# Patient Record
Sex: Male | Born: 1935 | Race: White | Hispanic: No | Marital: Married | Smoking: Former smoker
Health system: Southern US, Community
[De-identification: ages and names within clinical notes are randomized; demographics above are authoritative.]

## PROBLEM LIST (undated history)

## (undated) DIAGNOSIS — R42 Dizziness and giddiness: Secondary | ICD-10-CM

## (undated) DIAGNOSIS — I10 Essential (primary) hypertension: Secondary | ICD-10-CM

## (undated) DIAGNOSIS — N400 Enlarged prostate without lower urinary tract symptoms: Secondary | ICD-10-CM

## (undated) DIAGNOSIS — R451 Restlessness and agitation: Secondary | ICD-10-CM

## (undated) DIAGNOSIS — R413 Other amnesia: Secondary | ICD-10-CM

## (undated) DIAGNOSIS — F0390 Unspecified dementia without behavioral disturbance: Secondary | ICD-10-CM

## (undated) DIAGNOSIS — G43109 Migraine with aura, not intractable, without status migrainosus: Secondary | ICD-10-CM

## (undated) DIAGNOSIS — C801 Malignant (primary) neoplasm, unspecified: Secondary | ICD-10-CM

## (undated) HISTORY — DX: Migraine with aura, not intractable, without status migrainosus: G43.109

## (undated) HISTORY — DX: Dizziness and giddiness: R42

## (undated) HISTORY — DX: Benign prostatic hyperplasia without lower urinary tract symptoms: N40.0

## (undated) HISTORY — DX: Other amnesia: R41.3

## (undated) HISTORY — PX: BREAST LUMPECTOMY: SHX2

## (undated) HISTORY — PX: TONSILLECTOMY: SUR1361

## (undated) HISTORY — DX: Restlessness and agitation: R45.1

---
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
## 2012-11-07 DIAGNOSIS — N183 Chronic kidney disease, stage 3 unspecified: Secondary | ICD-10-CM

## 2012-11-07 DIAGNOSIS — E119 Type 2 diabetes mellitus without complications: Secondary | ICD-10-CM

## 2012-11-07 DIAGNOSIS — I10 Essential (primary) hypertension: Secondary | ICD-10-CM

## 2012-11-07 HISTORY — DX: Chronic kidney disease, stage 3 unspecified: N18.30

## 2012-11-07 HISTORY — DX: Type 2 diabetes mellitus without complications: E11.9

## 2012-11-07 HISTORY — DX: Essential (primary) hypertension: I10

## 2013-11-07 HISTORY — PX: SKIN CANCER EXCISION: SHX779

## 2015-12-09 DIAGNOSIS — F0390 Unspecified dementia without behavioral disturbance: Secondary | ICD-10-CM

## 2015-12-09 HISTORY — DX: Unspecified dementia, unspecified severity, without behavioral disturbance, psychotic disturbance, mood disturbance, and anxiety: F03.90

## 2016-06-07 DIAGNOSIS — C439 Malignant melanoma of skin, unspecified: Secondary | ICD-10-CM

## 2016-06-07 HISTORY — DX: Malignant melanoma of skin, unspecified: C43.9

## 2017-06-07 DIAGNOSIS — C801 Malignant (primary) neoplasm, unspecified: Secondary | ICD-10-CM

## 2017-06-07 HISTORY — DX: Malignant (primary) neoplasm, unspecified: C80.1

## 2019-03-04 ENCOUNTER — Other Ambulatory Visit: Payer: Self-pay

## 2019-03-04 ENCOUNTER — Emergency Department (HOSPITAL_COMMUNITY)
Admission: EM | Admit: 2019-03-04 | Discharge: 2019-03-04 | Disposition: A | Discharge status: Home / Self Care | Payer: Medicare Other | Attending: Emergency Medicine | Admitting: Emergency Medicine

## 2019-03-04 ENCOUNTER — Emergency Department (HOSPITAL_COMMUNITY): Payer: Medicare Other

## 2019-03-04 DIAGNOSIS — Y939 Activity, unspecified: Secondary | ICD-10-CM | POA: Diagnosis not present

## 2019-03-04 DIAGNOSIS — W19XXXA Unspecified fall, initial encounter: Secondary | ICD-10-CM

## 2019-03-04 DIAGNOSIS — W010XXA Fall on same level from slipping, tripping and stumbling without subsequent striking against object, initial encounter: Secondary | ICD-10-CM | POA: Diagnosis not present

## 2019-03-04 DIAGNOSIS — S0101XA Laceration without foreign body of scalp, initial encounter: Secondary | ICD-10-CM | POA: Diagnosis not present

## 2019-03-04 DIAGNOSIS — Y999 Unspecified external cause status: Secondary | ICD-10-CM | POA: Insufficient documentation

## 2019-03-04 DIAGNOSIS — Y929 Unspecified place or not applicable: Secondary | ICD-10-CM | POA: Diagnosis not present

## 2019-03-04 DIAGNOSIS — S0990XA Unspecified injury of head, initial encounter: Secondary | ICD-10-CM | POA: Diagnosis present

## 2019-03-04 DIAGNOSIS — F0390 Unspecified dementia without behavioral disturbance: Secondary | ICD-10-CM | POA: Insufficient documentation

## 2019-03-04 MED ORDER — LIDOCAINE HCL 2 % IJ SOLN
10.0000 mL | Freq: Once | INTRAMUSCULAR | Status: DC
  Filled 2019-03-04: qty 20

## 2019-03-04 NOTE — ED Notes (Signed)
Paperwork given to [employer]. Belongings given to [facility]. Discharge instructions reviewed. Pt transported back to [facility] via [facility]. This RN called report to facility, facility aware of care performed here and that pt is returning.

## 2019-03-04 NOTE — ED Notes (Signed)
Called [facility] 

## 2019-03-04 NOTE — ED Triage Notes (Signed)
Pt BIB [facility] for a fall. Pt resides at [facility]. Pt had a ground level fall today witnessed by his wife. Pt hit is head on an end table as he was falling and has a laceration to the top of his head from the fall. Pt has a pmh of dementia and is alert and oriented to person only, which is patient's baseline per EMS. No LOC per wife. Pt denies any pain at present time.

## 2019-03-04 NOTE — ED Notes (Signed)
[facility] called for transport back to [facility].  

## 2019-03-04 NOTE — ED Provider Notes (Signed)
[facility] Emergency Department Provider Note MRN:  [id number]  Arrival date & time: 03/04/19     Chief Complaint   Fall   History of Present Illness   Gary Roy is a 85 y.o. year-old male with a history of dementia presenting to the ED with chief complaint of fall.  Fall was reportedly witnessed by wife, who is now present.  Patient is oriented only to name.  Denies loss of consciousness, laceration to the top of his head.  Denies any other complaints.  I was unable to obtain an accurate HPI, PMH, or ROS due to the patient's dementia.  Review of Systems  Positive for ground-level fall, scalp laceration.  Patient's Health History   Past medical history: Dementia   No family history on file.  Social History   Socioeconomic History  . Marital status: Married    Spouse name: Not on file  . Number of children: Not on file  . Years of education: Not on file  . Highest education level: Not on file  Occupational History  . Not on file  Social Needs  . Financial resource strain: Not on file  . Food insecurity:    Worry: Not on file    Inability: Not on file  . Transportation needs:    Medical: Not on file    Non-medical: Not on file  Tobacco Use  . Smoking status: Not on file  Substance and Sexual Activity  . Alcohol use: Not on file  . Drug use: Not on file  . Sexual activity: Not on file  Lifestyle  . Physical activity:    Days per week: Not on file    Minutes per session: Not on file  . Stress: Not on file  Relationships  . Social connections:    Talks on phone: Not on file    Gets together: Not on file    Attends religious service: Not on file    Active member of club or organization: Not on file    Attends meetings of clubs or organizations: Not on file    Relationship status: Not on file  . Intimate partner violence:    Fear of current or ex partner: Not on file    Emotionally abused: Not on file    Physically abused: Not on file     Forced sexual activity: Not on file  Other Topics Concern  . Not on file  Social History Narrative  . Not on file     Physical Exam  Vital Signs and Nursing Notes reviewed Vitals:   03/04/19 1600 03/04/19 1645  BP: (!) 149/53 (!) 144/62  Pulse: (!) 49 (!) 49  Resp: 18 20  Temp:    SpO2: 96% 96%    CONSTITUTIONAL: Chronically ill-appearing, NAD NEURO:  Alert and oriented x 3, no focal deficits EYES:  eyes equal and reactive ENT/NECK:  no LAD, no JVD CARDIO: Bradycardic rate, well-perfused, normal S1 and S2 PULM:  CTAB no wheezing or rhonchi GI/GU:  normal bowel sounds, non-distended, non-tender MSK/SPINE:  No gross deformities, no edema SKIN: Puncture wound laceration to the vertex to the level of the periosteum, 3 cm PSYCH:  Appropriate speech and behavior  Diagnostic and Interventional Summary    EKG Interpretation  Date/Time:  Monday March 04 2019 17:08:49 EDT Ventricular Rate:  48 PR Interval:    QRS Duration: 110 QT Interval:  501 QTC Calculation: 448 R Axis:   57 Text Interpretation:  Sinus bradycardia Confirmed by Sedonia Small,  Legrand Como [phone]) on 03/04/2019 7:04:22 PM       Labs Reviewed - No data to display  CT CERVICAL SPINE WO CONTRAST  Final Result    CT HEAD WO CONTRAST  Final Result      Medications  lidocaine (XYLOCAINE) 2 % (with pres) injection 200 mg (has no administration in time range)     .Marland KitchenLaceration Repair Date/Time: 03/04/2019 7:04 PM Performed by: Maudie Flakes, MD Authorized by: Maudie Flakes, MD   Consent:    Consent obtained:  Verbal   Consent given by:  Healthcare agent Anesthesia (see MAR for exact dosages):    Anesthesia method:  Local infiltration   Local anesthetic:  Lidocaine 2% WITH epi Laceration details:    Location:  Scalp   Scalp location:  Crown   Length (cm):  3   Depth (mm):  3 Repair type:    Repair type:  Simple Pre-procedure details:    Preparation:  Patient was prepped and draped in usual sterile  fashion Exploration:    Hemostasis achieved with:  Direct pressure and epinephrine   Wound exploration: wound explored through full range of motion and entire depth of wound probed and visualized     Contaminated: no   Treatment:    Area cleansed with:  Saline   Amount of cleaning:  Standard   Irrigation solution:  Sterile water   Irrigation method:  Syringe Skin repair:    Repair method:  Sutures   Suture size:  4-0   Suture material:  Prolene   Suture technique:  Simple interrupted   Number of sutures:  5 Approximation:    Approximation:  Close Post-procedure details:    Dressing:  Non-adherent dressing   Patient tolerance of procedure:  Tolerated well, no immediate complications   Critical Care  ED Course and Medical Decision Making  I have reviewed the triage vital signs and the nursing notes.  Pertinent labs & imaging results that were available during my care of the patient were reviewed by me and considered in my medical decision making (see below for details).  CT head and cervical spine to exclude skull fracture, subdural hematoma, cervical spinal fracture.  Will need to obtain more information from family as patient has significant dementia.  Presumed ground-level fall, presumed mechanical but will need to confirm.  Was able to reach patient's daughter over the phone, who confirms that patient was standing from a seated position and attempting to grab his walker when he lost his balance and fell.  Mechanical fall, no need for other testing today.  CTs are reassuring, sutured as described above.  After the discussed management above, the patient was determined to be safe for discharge.  The patient was in agreement with this plan and all questions regarding their care were answered.  ED return precautions were discussed and the patient will return to the ED with any significant worsening of condition.  Barth Kirks. Sedonia Small, [place] [email or web address]  Final Clinical Impressions(s) / ED Diagnoses     ICD-10-CM   1. Fall, initial encounter W19.XXXA   2. Laceration of scalp, initial encounter [email or web address]     ED Discharge Orders    None         Maudie Flakes, MD 03/04/19 [phone]

## 2019-03-04 NOTE — Discharge Instructions (Addendum)
You were evaluated in the Emergency Department and after careful evaluation, we did not find any emergent condition requiring admission or further testing in the hospital.  Your symptoms today seem to be due to a laceration to the scalp.  We did CT scans today to make sure that there were no underlying injuries.  They were normal.  Your sutures will need to be removed in 7 to 10 days.  This can be done by any healthcare professional.  Please return to the Emergency Department if you experience any worsening of your condition.  We encourage you to follow up with a primary care provider.  Thank you for allowing [place] to be a part of your care.

## 2019-08-19 ENCOUNTER — Encounter (HOSPITAL_COMMUNITY): Payer: Self-pay | Admitting: Emergency Medicine

## 2019-08-19 ENCOUNTER — Other Ambulatory Visit: Payer: Self-pay

## 2019-08-19 ENCOUNTER — Emergency Department (HOSPITAL_COMMUNITY): Payer: Medicare Other

## 2019-08-19 ENCOUNTER — Inpatient Hospital Stay (HOSPITAL_COMMUNITY)
Admission: EM | Admit: 2019-08-19 | Discharge: 2019-08-22 | DRG: 683 | Disposition: A | Discharge status: Home Health | Payer: Medicare Other | Attending: Internal Medicine | Admitting: Internal Medicine

## 2019-08-19 DIAGNOSIS — Y92009 Unspecified place in unspecified non-institutional (private) residence as the place of occurrence of the external cause: Secondary | ICD-10-CM

## 2019-08-19 DIAGNOSIS — E785 Hyperlipidemia, unspecified: Secondary | ICD-10-CM

## 2019-08-19 DIAGNOSIS — R55 Syncope and collapse: Secondary | ICD-10-CM | POA: Diagnosis not present

## 2019-08-19 DIAGNOSIS — G3183 Dementia with Lewy bodies: Secondary | ICD-10-CM

## 2019-08-19 DIAGNOSIS — R748 Abnormal levels of other serum enzymes: Secondary | ICD-10-CM

## 2019-08-19 DIAGNOSIS — E1122 Type 2 diabetes mellitus with diabetic chronic kidney disease: Secondary | ICD-10-CM

## 2019-08-19 DIAGNOSIS — F0281 Dementia in other diseases classified elsewhere with behavioral disturbance: Secondary | ICD-10-CM | POA: Diagnosis present

## 2019-08-19 DIAGNOSIS — R001 Bradycardia, unspecified: Secondary | ICD-10-CM | POA: Diagnosis not present

## 2019-08-19 DIAGNOSIS — Y92003 Bedroom of unspecified non-institutional (private) residence as the place of occurrence of the external cause: Secondary | ICD-10-CM

## 2019-08-19 DIAGNOSIS — N179 Acute kidney failure, unspecified: Principal | ICD-10-CM | POA: Diagnosis present

## 2019-08-19 DIAGNOSIS — Z853 Personal history of malignant neoplasm of breast: Secondary | ICD-10-CM

## 2019-08-19 DIAGNOSIS — N183 Chronic kidney disease, stage 3 unspecified: Secondary | ICD-10-CM

## 2019-08-19 DIAGNOSIS — W19XXXA Unspecified fall, initial encounter: Secondary | ICD-10-CM | POA: Diagnosis present

## 2019-08-19 DIAGNOSIS — Z515 Encounter for palliative care: Secondary | ICD-10-CM

## 2019-08-19 DIAGNOSIS — D539 Nutritional anemia, unspecified: Secondary | ICD-10-CM

## 2019-08-19 DIAGNOSIS — N1831 Chronic kidney disease, stage 3a: Secondary | ICD-10-CM | POA: Diagnosis present

## 2019-08-19 DIAGNOSIS — I129 Hypertensive chronic kidney disease with stage 1 through stage 4 chronic kidney disease, or unspecified chronic kidney disease: Secondary | ICD-10-CM | POA: Diagnosis not present

## 2019-08-19 DIAGNOSIS — Z79899 Other long term (current) drug therapy: Secondary | ICD-10-CM

## 2019-08-19 DIAGNOSIS — F0280 Dementia in other diseases classified elsewhere without behavioral disturbance: Secondary | ICD-10-CM

## 2019-08-19 DIAGNOSIS — Z66 Do not resuscitate: Secondary | ICD-10-CM

## 2019-08-19 DIAGNOSIS — Z20828 Contact with and (suspected) exposure to other viral communicable diseases: Secondary | ICD-10-CM | POA: Diagnosis present

## 2019-08-19 LAB — CBC WITH DIFFERENTIAL/PLATELET
Abs Immature Granulocytes: 0.02 10*3/uL (ref 0.00–0.07)
Basophils Absolute: 0 10*3/uL (ref 0.0–0.1)
Basophils Relative: 0 %
Eosinophils Absolute: 0.1 10*3/uL (ref 0.0–0.5)
Eosinophils Relative: 1 %
HCT: 35.4 % — ABNORMAL LOW (ref 39.0–52.0)
Hemoglobin: 11.3 g/dL — ABNORMAL LOW (ref 13.0–17.0)
Immature Granulocytes: 0 %
Lymphocytes Relative: 15 %
Lymphs Abs: 1.2 10*3/uL (ref 0.7–4.0)
MCH: 32.6 pg (ref 26.0–34.0)
MCHC: 31.9 g/dL (ref 30.0–36.0)
MCV: 102 fL — ABNORMAL HIGH (ref 80.0–100.0)
Monocytes Absolute: 0.6 10*3/uL (ref 0.1–1.0)
Monocytes Relative: 7 %
Neutro Abs: 6.3 10*3/uL (ref 1.7–7.7)
Neutrophils Relative %: 77 %
Platelets: 151 10*3/uL (ref 150–400)
RBC: 3.47 MIL/uL — ABNORMAL LOW (ref 4.22–5.81)
RDW: 12.9 % (ref 11.5–15.5)
WBC: 8.2 10*3/uL (ref 4.0–10.5)
nRBC: 0 % (ref 0.0–0.2)

## 2019-08-19 LAB — BASIC METABOLIC PANEL
Anion gap: 10 (ref 5–15)
Anion gap: 10 (ref 5–15)
Anion gap: 10 (ref 5–15)
BUN: 30 mg/dL — ABNORMAL HIGH (ref 8–23)
BUN: 28 mg/dL — ABNORMAL HIGH (ref 8–23)
BUN: 24 mg/dL — ABNORMAL HIGH (ref 8–23)
CO2: 22 mmol/L (ref 22–32)
CO2: 18 mmol/L — ABNORMAL LOW (ref 22–32)
CO2: 18 mmol/L — ABNORMAL LOW (ref 22–32)
Calcium: 9 mg/dL (ref 8.9–10.3)
Calcium: 8.6 mg/dL — ABNORMAL LOW (ref 8.9–10.3)
Calcium: 8.5 mg/dL — ABNORMAL LOW (ref 8.9–10.3)
Chloride: 111 mmol/L (ref 98–111)
Chloride: 112 mmol/L — ABNORMAL HIGH (ref 98–111)
Chloride: 113 mmol/L — ABNORMAL HIGH (ref 98–111)
Creatinine, Ser: 1.73 mg/dL — ABNORMAL HIGH (ref 0.61–1.24)
Creatinine, Ser: 1.45 mg/dL — ABNORMAL HIGH (ref 0.61–1.24)
Creatinine, Ser: 1.36 mg/dL — ABNORMAL HIGH (ref 0.61–1.24)
GFR calc Af Amer: 40 mL/min — ABNORMAL LOW (ref >60)
GFR calc Af Amer: 49 mL/min — ABNORMAL LOW (ref >60)
GFR calc Af Amer: 53 mL/min — ABNORMAL LOW (ref >60)
GFR calc non Af Amer: 34 mL/min — ABNORMAL LOW (ref >60)
GFR calc non Af Amer: 42 mL/min — ABNORMAL LOW (ref >60)
GFR calc non Af Amer: 46 mL/min — ABNORMAL LOW (ref >60)
Glucose, Bld: 115 mg/dL — ABNORMAL HIGH (ref 70–99)
Glucose, Bld: 93 mg/dL (ref 70–99)
Glucose, Bld: 75 mg/dL (ref 70–99)
Potassium: 4.8 mmol/L (ref 3.5–5.1)
Potassium: 3.9 mmol/L (ref 3.5–5.1)
Potassium: 3.8 mmol/L (ref 3.5–5.1)
Sodium: 143 mmol/L (ref 135–145)
Sodium: 140 mmol/L (ref 135–145)
Sodium: 141 mmol/L (ref 135–145)

## 2019-08-19 LAB — URINALYSIS, ROUTINE W REFLEX MICROSCOPIC
Bilirubin Urine: NEGATIVE
Glucose, UA: NEGATIVE mg/dL
Ketones, ur: 5 mg/dL — AB
Leukocytes,Ua: NEGATIVE
Nitrite: NEGATIVE
Protein, ur: 30 mg/dL — AB
Specific Gravity, Urine: 1.016 (ref 1.005–1.030)
pH: 5 (ref 5.0–8.0)

## 2019-08-19 LAB — CK
Total CK: 983 U/L — ABNORMAL HIGH (ref 49–397)
Total CK: 878 U/L — ABNORMAL HIGH (ref 49–397)

## 2019-08-19 LAB — CK TOTAL AND CKMB (NOT AT ARMC)
CK, MB: 7.7 ng/mL — ABNORMAL HIGH (ref 0.5–5.0)
Relative Index: 1.5 (ref 0.0–2.5)
Total CK: 527 U/L — ABNORMAL HIGH (ref 49–397)

## 2019-08-19 LAB — MAGNESIUM: Magnesium: 1.7 mg/dL (ref 1.7–2.4)

## 2019-08-19 LAB — TSH: TSH: 1.862 u[IU]/mL (ref 0.350–4.500)

## 2019-08-19 LAB — SARS CORONAVIRUS 2 (TAT 6-24 HRS): SARS Coronavirus 2: NEGATIVE

## 2019-08-19 LAB — HEMOGLOBIN A1C
Hgb A1c MFr Bld: 6 % — ABNORMAL HIGH (ref 4.8–5.6)
Mean Plasma Glucose: 125.5 mg/dL

## 2019-08-19 LAB — PHOSPHORUS: Phosphorus: 2.9 mg/dL (ref 2.5–4.6)

## 2019-08-19 LAB — VITAMIN B12: Vitamin B-12: 396 pg/mL (ref 180–914)

## 2019-08-19 MED ORDER — ENOXAPARIN SODIUM 30 MG/0.3ML ~~LOC~~ SOLN
30.0000 mg | SUBCUTANEOUS | Status: DC
  Administered 2019-08-19 – 2019-08-20 (×2): 30 mg via SUBCUTANEOUS
  Filled 2019-08-19 (×2): qty 0.3

## 2019-08-19 MED ORDER — INSULIN ASPART 100 UNIT/ML ~~LOC~~ SOLN
0.0000 [IU] | Freq: Three times a day (TID) | SUBCUTANEOUS | Status: DC
  Administered 2019-08-21: 2 [IU] via SUBCUTANEOUS
  Administered 2019-08-22: 1 [IU] via SUBCUTANEOUS

## 2019-08-19 MED ORDER — SODIUM CHLORIDE 0.9 % IV BOLUS
1000.0000 mL | Freq: Once | INTRAVENOUS | Status: AC
  Administered 2019-08-19: 11:00:00 1000 mL via INTRAVENOUS

## 2019-08-19 MED ORDER — DONEPEZIL HCL 23 MG PO TABS: 23.0000 mg | ORAL_TABLET | Freq: Every evening | ORAL | Status: DC

## 2019-08-19 MED ORDER — PANTOPRAZOLE SODIUM 40 MG PO TBEC
40.0000 mg | DELAYED_RELEASE_TABLET | Freq: Every day | ORAL | Status: DC
  Administered 2019-08-20 – 2019-08-22 (×3): 40 mg via ORAL
  Filled 2019-08-19 (×3): qty 1

## 2019-08-19 MED ORDER — ACETAMINOPHEN 650 MG RE SUPP: 650.0000 mg | Freq: Four times a day (QID) | RECTAL | Status: DC | PRN

## 2019-08-19 MED ORDER — ACETAMINOPHEN 325 MG PO TABS: 650.0000 mg | ORAL_TABLET | Freq: Four times a day (QID) | ORAL | Status: DC | PRN

## 2019-08-19 MED ORDER — SODIUM CHLORIDE 0.9 % IV SOLN
INTRAVENOUS | Status: DC
  Administered 2019-08-19 – 2019-08-21 (×3): via INTRAVENOUS

## 2019-08-19 MED ORDER — MEMANTINE HCL 10 MG PO TABS
10.0000 mg | ORAL_TABLET | Freq: Two times a day (BID) | ORAL | Status: DC
  Administered 2019-08-19 – 2019-08-22 (×6): 10 mg via ORAL
  Filled 2019-08-19 (×7): qty 1

## 2019-08-19 MED ORDER — DONEPEZIL HCL 5 MG PO TABS
10.0000 mg | ORAL_TABLET | Freq: Every day | ORAL | Status: DC
  Administered 2019-08-19 – 2019-08-21 (×3): 10 mg via ORAL
  Filled 2019-08-19 (×3): qty 2

## 2019-08-19 MED ORDER — SENNOSIDES-DOCUSATE SODIUM 8.6-50 MG PO TABS: 1.0000 | ORAL_TABLET | Freq: Every evening | ORAL | Status: DC | PRN

## 2019-08-19 NOTE — Discharge Planning (Signed)
EDCM contacted pt wife, [name] to discuss hospital bed delivery.  [name] states she will have their bed removed to make room for hospital bed by tomorrow morning.  Please call her prior to bed delivery and prior to pt discharging home:  [phone].

## 2019-08-19 NOTE — Evaluation (Signed)
Physical Therapy Evaluation Patient Details Name: Gary Roy MRN: [id number] DOB: Jul 22, 1928 Today's Date: 08/19/2019   History of Present Illness  Pt is an 85 y/o male admitted following unwitnessed fall. CT of head and C spine negative for acute abnormality. PMH includes dementia and HTN.   Clinical Impression  Pt admitted secondary to problem above with deficits below. Pt with dementia at baseline, however, unsure if pt is currently at baseline as no family present. When asked questions, pt with very garbled speech, so unable to determine PLOF. Pt requiring mod to max A +2 to perform bed mobility and to stand at EOB. Feel pt will require increased assist at d/c and is a very high fall risk. Will continue to follow acutely to maximize functional mobility independence and safety.     Follow Up Recommendations SNF;Supervision/Assistance - 24 hour(IF family refuses, max HH services)    Equipment Recommendations  Wheelchair (measurements PT);Wheelchair cushion (measurements PT);Hospital bed;Other (comment)(hoyer lift; hoyer lift pad )    Recommendations for Other Services       Precautions / Restrictions Precautions Precautions: Fall Restrictions Weight Bearing Restrictions: No      Mobility  Bed Mobility Overal bed mobility: Needs Assistance Bed Mobility: Supine to Sit;Sit to Supine     Supine to sit: Mod assist;Max assist;+2 for physical assistance Sit to supine: Mod assist;Max assist;+2 for physical assistance   General bed mobility comments: Mod-max A +2 for assist with LEs and trunk elevation. Increased time to perform bed mobility tasks and required multimodal cues.   Transfers Overall transfer level: Needs assistance Equipment used: 2 person hand held assist Transfers: Sit to/from Stand Sit to Stand: Max assist;+2 physical assistance         General transfer comment: Max A +2 to stand. Pt able to clear hips from edge of stretcher, however, unable to come to  full upright positioning.   Ambulation/Gait                Stairs            Wheelchair Mobility    Modified Rankin (Stroke Patients Only)       Balance Overall balance assessment: Needs assistance Sitting-balance support: No upper extremity supported;Feet supported Sitting balance-Leahy Scale: Fair     Standing balance support: Bilateral upper extremity supported;During functional activity Standing balance-Leahy Scale: Poor Standing balance comment: Reliant on UE and external support.                              Pertinent Vitals/Pain Pain Assessment: Faces Faces Pain Scale: Hurts little more Pain Location: generalized with movement  Pain Descriptors / Indicators: Grimacing;Guarding Pain Intervention(s): Limited activity within patient's tolerance;Monitored during session;Repositioned    Home Living Family/patient expects to be discharged to:: Unsure                 Additional Comments: Pt unable to provide home information. When asked questions, speech often garbled.     Prior Function           Comments: Unsure of PLOF given cognitive deficits. Pt's speech often garbled and unintelligible     Hand Dominance        Extremity/Trunk Assessment   Upper Extremity Assessment Upper Extremity Assessment: Defer to OT evaluation    Lower Extremity Assessment Lower Extremity Assessment: Generalized weakness;Difficult to assess due to impaired cognition    Cervical / Trunk Assessment Cervical / Trunk Assessment: Kyphotic  Communication   Communication: HOH;Expressive difficulties(garbled speech)  Cognition Arousal/Alertness: Awake/alert Behavior During Therapy: Flat affect Overall Cognitive Status: No family/caregiver present to determine baseline cognitive functioning                                 General Comments: Pt with hx of dementia at baseline, however, unsure if pt is at baseline. Pt able to answer some  questions, however, speech often unintelligible and garbled.       General Comments General comments (skin integrity, edema, etc.): No family present during session.     Exercises     Assessment/Plan    PT Assessment Patient needs continued PT services  PT Problem List Decreased strength;Decreased balance;Decreased mobility;Decreased cognition;Decreased knowledge of use of DME;Decreased safety awareness;Decreased knowledge of precautions       PT Treatment Interventions DME instruction;Gait training;Functional mobility training;Therapeutic activities;Therapeutic exercise;Balance training;Patient/family education;Cognitive remediation    PT Goals (Current goals can be found in the Care Plan section)  Acute Rehab PT Goals PT Goal Formulation: Patient unable to participate in goal setting Time For Goal Achievement: 09/02/19 Potential to Achieve Goals: Fair    Frequency Min 2X/week   Barriers to discharge Other (comment) Unsure of caregiver support     Co-evaluation PT/OT/SLP Co-Evaluation/Treatment: Yes Reason for Co-Treatment: Complexity of the patient's impairments (multi-system involvement);Necessary to address cognition/behavior during functional activity;For patient/therapist safety;To address functional/ADL transfers PT goals addressed during session: Mobility/safety with mobility;Balance         AM-PAC PT "6 Clicks" Mobility  Outcome Measure Help needed turning from your back to your side while in a flat bed without using bedrails?: A Lot Help needed moving from lying on your back to sitting on the side of a flat bed without using bedrails?: Total Help needed moving to and from a bed to a chair (including a wheelchair)?: Total Help needed standing up from a chair using your arms (e.g., wheelchair or bedside chair)?: Total Help needed to walk in hospital room?: Total Help needed climbing 3-5 steps with a railing? : Total 6 Click Score: 7    End of Session    Activity Tolerance: Patient tolerated treatment well Patient left: in bed;with call bell/phone within reach Nurse Communication: Mobility status PT Visit Diagnosis: Unsteadiness on feet (R26.81);Muscle weakness (generalized) (M62.81);History of falling (Z91.81);Repeated falls (R29.6);Difficulty in walking, not elsewhere classified (R26.2)    Time: [id number] PT Time Calculation (min) (ACUTE ONLY): 18 min   Charges:   PT Evaluation $PT Eval Moderate Complexity: [place], PT, DPT  Acute Rehabilitation Services  Pager: [phone] Office: [phone]   Rudean Hitt 08/19/2019, 4:04 PM

## 2019-08-19 NOTE — ED Provider Notes (Addendum)
[facility] EMERGENCY DEPARTMENT Provider Note   CSN: [id number] Arrival date & time: 08/19/19  [id number]     History   Chief Complaint Chief Complaint  Patient presents with  . Fall    HPI Gary Roy is a 85 y.o. male with PMH/o Dementia, HTN brought in by [facility] for evaluation of unwitnessed fall. Wife found him this AM on the floor with head on the dresser. Unsure of time of fall. Wife last saw normal at 9pm and she found him this AM. EMS denies any blood thinner.   EM LEVEL 5 CAVEAT DUE TO DEMENTIA      The history is provided by the EMS personnel.    Past Medical History:  Diagnosis Date  . Cancer ([place])   . Dementia ([place])   . Hypertension     Patient Active Problem List   Diagnosis Date Noted  . Fall at home 08/19/2019    Past Surgical History:  Procedure Laterality Date  . BREAST LUMPECTOMY          Home Medications    Prior to Admission medications   Medication Sig Start Date End Date Taking? Authorizing Provider  amLODipine (NORVASC) 5 MG tablet Take 2.5 mg by mouth daily.   Yes [provider]  atorvastatin (LIPITOR) 20 MG tablet Take 20 mg by mouth at bedtime.   Yes [provider]  donepezil (ARICEPT) 23 MG TABS tablet Take 23 mg by mouth every evening.   Yes [provider]  esomeprazole (NEXIUM) 40 MG capsule Take 40 mg by mouth daily at 12 noon.   Yes [provider]  lisinopril (ZESTRIL) 20 MG tablet Take 10 mg by mouth daily.   Yes [provider]  memantine (NAMENDA) 10 MG tablet Take 10 mg by mouth 2 (two) times daily.   Yes [provider]  sitaGLIPtin (JANUVIA) 50 MG tablet Take 50 mg by mouth daily.   Yes [provider]  solifenacin (VESICARE) 10 MG tablet Take 10 mg by mouth every evening.   Yes [provider]  tamoxifen (NOLVADEX) 20 MG tablet Take 20 mg by mouth every evening.   Yes [provider]    Family History No family history  on file.  Social History Social History   Tobacco Use  . Smoking status: Not on file  Substance Use Topics  . Alcohol use: Not on file  . Drug use: Not on file     Allergies   Patient has no known allergies.   Review of Systems Review of Systems  Unable to perform ROS: Dementia     Physical Exam Updated Vital Signs BP (!) 164/56   Pulse (!) 30   Temp 98.4 F (36.9 C) (Oral)   Resp 18   Ht 5\' 5"  (1.651 m)   Wt 64.9 kg   SpO2 93%   BMI 23.81 kg/m   Physical Exam Vitals signs and nursing note reviewed.  Constitutional:      Appearance: Normal appearance. He is well-developed.  HENT:     Head: Normocephalic and atraumatic.     Comments: No tenderness to palpation of skull. No deformities or crepitus noted. No open wounds, abrasions or lacerations.  Eyes:     General: Lids are normal.     Conjunctiva/sclera: Conjunctivae normal.     Pupils: Pupils are equal, round, and reactive to light.     Comments: 2 mm pupils bilaterally  Neck:     Comments: C Collar in  place  Cardiovascular:     Rate and Rhythm: Normal rate and regular rhythm.     Pulses: Normal pulses.     Heart sounds: Normal heart sounds. No murmur. No friction rub. No gallop.   Pulmonary:     Effort: Pulmonary effort is normal.     Breath sounds: Normal breath sounds.  Abdominal:     Palpations: Abdomen is soft. Abdomen is not rigid.     Tenderness: There is no abdominal tenderness. There is no guarding.  Musculoskeletal: Normal range of motion.     Comments: No pelvic instability   Skin:    General: Skin is warm and dry.     Capillary Refill: Capillary refill takes less than 2 seconds.  Neurological:     Mental Status: He is alert.     Comments: Alert but demented  Answers some questions Follows some commands Moves all extremities spontaneously. 5/5 BUE and BLE   Psychiatric:        Speech: Speech normal.      ED Treatments / Results  Labs (all labs ordered are listed, but only  abnormal results are displayed) Labs Reviewed  CBC WITH DIFFERENTIAL/PLATELET - Abnormal; Notable for the following components:      Result Value   RBC 3.47 (*)    Hemoglobin 11.3 (*)    HCT 35.4 (*)    MCV 102.0 (*)    All other components within normal limits  CK TOTAL AND CKMB (NOT AT [facility]) - Abnormal; Notable for the following components:   Total CK 527 (*)    CK, MB 7.7 (*)    All other components within normal limits  BASIC METABOLIC PANEL - Abnormal; Notable for the following components:   Glucose, Bld 115 (*)    BUN 30 (*)    Creatinine, Ser 1.73 (*)    GFR calc non Af Amer 34 (*)    GFR calc Af Amer 40 (*)    All other components within normal limits  URINALYSIS, ROUTINE W REFLEX MICROSCOPIC - Abnormal; Notable for the following components:   Hgb urine dipstick MODERATE (*)    Ketones, ur 5 (*)    Protein, ur 30 (*)    Bacteria, UA RARE (*)    All other components within normal limits  SARS CORONAVIRUS 2 (TAT 6-24 HRS)  CK  MAGNESIUM  PHOSPHORUS  BASIC METABOLIC PANEL  VITAMIN 123456  TSH    EKG None  Radiology Ct Head Wo Contrast  Result Date: 08/19/2019 CLINICAL DATA:  Poor historian due to dementia, per triage: Wife found him on the floor on his left side with head up against dresser this morning they went to bed at 9 pm last night wife unsure of how long he has been there. Swelling to left eye. EXAM: CT HEAD WITHOUT CONTRAST CT CERVICAL SPINE WITHOUT CONTRAST TECHNIQUE: Multidetector CT imaging of the head and cervical spine was performed following the standard protocol without intravenous contrast. Multiplanar CT image reconstructions of the cervical spine were also generated. COMPARISON:  Head CT 03/04/2019 FINDINGS: CT HEAD FINDINGS Brain: No acute intracranial hemorrhage. No focal mass lesion. No CT evidence of acute infarction. No midline shift or mass effect. No hydrocephalus. Basilar cisterns are patent. There are periventricular and subcortical white matter  hypodensities. Generalized cortical atrophy. Vascular: No hyperdense vessel or unexpected calcification. Skull: Normal. Negative for fracture or focal lesion. Sinuses/Orbits: Paranasal sinuses and mastoid air cells are clear. Orbits are clear. Other: None. CT CERVICAL SPINE FINDINGS  Alignment: Normal alignment of the cervical vertebral bodies. Skull base and vertebrae: Normal craniocervical junction. No loss of vertebral body height or disc height. Normal facet articulation. No evidence of fracture. Soft tissues and spinal canal: No prevertebral soft tissue swelling. No perispinal or epidural hematoma. Disc levels:  Unremarkable Upper chest: Clear Other: None IMPRESSION: 1. No acute intracranial trauma. 2. Atrophy and white matter microvascular disease. 3. No cervical spine fracture. Electronically Signed   By: Suzy Bouchard M.D.   On: 08/19/2019 10:40   Ct Cervical Spine Wo Contrast  Result Date: 08/19/2019 CLINICAL DATA:  Poor historian due to dementia, per triage: Wife found him on the floor on his left side with head up against dresser this morning they went to bed at 9 pm last night wife unsure of how long he has been there. Swelling to left eye. EXAM: CT HEAD WITHOUT CONTRAST CT CERVICAL SPINE WITHOUT CONTRAST TECHNIQUE: Multidetector CT imaging of the head and cervical spine was performed following the standard protocol without intravenous contrast. Multiplanar CT image reconstructions of the cervical spine were also generated. COMPARISON:  Head CT 03/04/2019 FINDINGS: CT HEAD FINDINGS Brain: No acute intracranial hemorrhage. No focal mass lesion. No CT evidence of acute infarction. No midline shift or mass effect. No hydrocephalus. Basilar cisterns are patent. There are periventricular and subcortical white matter hypodensities. Generalized cortical atrophy. Vascular: No hyperdense vessel or unexpected calcification. Skull: Normal. Negative for fracture or focal lesion. Sinuses/Orbits: Paranasal  sinuses and mastoid air cells are clear. Orbits are clear. Other: None. CT CERVICAL SPINE FINDINGS Alignment: Normal alignment of the cervical vertebral bodies. Skull base and vertebrae: Normal craniocervical junction. No loss of vertebral body height or disc height. Normal facet articulation. No evidence of fracture. Soft tissues and spinal canal: No prevertebral soft tissue swelling. No perispinal or epidural hematoma. Disc levels:  Unremarkable Upper chest: Clear Other: None IMPRESSION: 1. No acute intracranial trauma. 2. Atrophy and white matter microvascular disease. 3. No cervical spine fracture. Electronically Signed   By: Suzy Bouchard M.D.   On: 08/19/2019 10:40    Procedures Procedures (including critical care time)  Medications Ordered in ED Medications  enoxaparin (LOVENOX) injection 30 mg (has no administration in time range)  acetaminophen (TYLENOL) tablet 650 mg (has no administration in time range)    Or  acetaminophen (TYLENOL) suppository 650 mg (has no administration in time range)  senna-docusate (Senokot-S) tablet 1 tablet (has no administration in time range)  donepezil (ARICEPT) tablet 23 mg (has no administration in time range)  pantoprazole (PROTONIX) EC tablet 40 mg (has no administration in time range)  memantine (NAMENDA) tablet 10 mg (has no administration in time range)  sodium chloride 0.9 % bolus 1,000 mL (1,000 mLs Intravenous New Bag/Given 08/19/19 1110)     Initial Impression / Assessment and Plan / ED Course  I have reviewed the triage vital signs and the nursing notes.  Pertinent labs & imaging results that were available during my care of the patient were reviewed by me and considered in my medical decision making (see chart for details).        85 year old male who presents for evaluation of unwitnessed fall.  Last seen normal at 9 PM last night.  Wife woke up this morning found him on the floor.  Unsure of how long he had been down there.   Patient with baseline history of dementia.  Initial ED arrival, he is afebrile, nontoxic-appearing.  Limited history secondary to dementia.  Will plan  for labs, imaging.  CT head shows no acute intracranial trauma.  No cervical spine fracture noted.  BMP shows BUN of 30, creatinine of 1.73.  No priors for comparison.  CBC shows hemoglobin 11.3.  No evidence of leukocytosis.  CK total is 527.  UA shows hemoglobin, no red blood cells.  No evidence of infectious etiology.  I attempted to call patient's wife with no answer.  Additionally, I attempted to call both daughters, Letitia Libra amd Read [employer] and was unable to get in contact with somebody.  Unfortunately patient is very confused.  I do not know what his baseline is unable to get in contact with any family.  There is no baseline labs for comparison.  Question of this is early onset rhabdo.  He has been hydrated here in the ED but feel he needs medical admission for observation.  Discussed with family medicine. Will admit the patient for overnight observation.   Patient family contact:  Letitia Libra: [id number] [phone]  RN was able to get in touch with wife, Gary Roy.  She is agreeable to admission.  She feels that patient needs a hospital bed because he has a propensity of trying to get up in the middle night and is worried about continued falling.  She can be reached at [phone].     Durable Medical Equipment  (From admission, onward)         Start     Ordered   08/19/19 1359  For home use only DME Hospital bed  Once    Question Answer Comment  Length of Need Lifetime   Patient has (list medical condition): Dementia   Head must be elevated greater than: 45 degrees   Bed type Semi-electric   Support Surface: Low Air loss Mattress      08/19/19 1359           Portions of this note were generated with Dragon dictation software. Dictation errors may occur despite best attempts at  proofreading.    Final Clinical Impressions(s) / ED Diagnoses   Final diagnoses:  Fall, initial encounter  Elevated CK  AKI (acute kidney injury) [facility])    ED Discharge Orders    None       Volanda Napoleon, PA-C 08/19/19 1355    Volanda Napoleon, PA-C 08/19/19 1359    Elnora Morrison, MD 08/19/19 1654

## 2019-08-19 NOTE — H&P (Addendum)
Date: 08/19/2019               Patient Name:  Gary Roy MRN: [id number]  DOB: 1928-08-17 Age / Sex: 85 y.o., male   PCP: Patient, No Pcp Per              Medical Service: Internal Medicine Teaching Service              Attending Physician: Dr. Velna Ochs, MD    First Contact: Flonnie Hailstone, MS4 Pager: [phone]  Second Contact: Dr. Molli Hazard Pager: [phone]            After Hours (After 5p/  First Contact Pager: [phone]  weekends / holidays): Second Contact Pager: [phone]   Chief Complaint: Ground-level fall  History of Present Illness: Gary Roy is a 85yo male with a h/o dementia, hypertension and breast cancer presenting due to a fall. Pt endorses that he is not in pain. Accurate HPI, PMH and ROS could not be obtained 2/2 patient's dementia versus acute AMS. No additional complaints.  Per ED, pt went to bed at 9pm. He was found down this morning at 9am. It is unclear how long pt was down. At baseline, pt follows basic commands and is slightly altered.  Per daughter and wife, patient and wife live in assisted living with a caretaker couple hours every day at [employer] [facility] at [facility]. He normally walks "a little sometimes." He has had lewy body dementia for 3-7 years, particularly worse in the last 3 years. He has good days where he makes some sense but bad days he speaks gibberish. He has been doing "worse" the last two weeks with decreased mentation and more difficulty with speech than usual; he has not been wanting to get out of bed and has been incontinent for 2 weeks. He can swallow regular food. Daughter states patient is DNR. In the past, hospice care had been discussed but patient did not qualify.   In ED, CT head showed no acute intracranial trauma. No cervical spine fracture noted. Meds: Current Meds  Medication Sig  . amLODipine (NORVASC) 5 MG tablet Take 2.5 mg by mouth daily.  Marland Kitchen atorvastatin (LIPITOR) 20 MG tablet Take 20 mg by mouth at bedtime.  .  donepezil (ARICEPT) 23 MG TABS tablet Take 23 mg by mouth every evening.  Marland Kitchen esomeprazole (NEXIUM) 40 MG capsule Take 40 mg by mouth daily at 12 noon.  Marland Kitchen lisinopril (ZESTRIL) 20 MG tablet Take 10 mg by mouth daily.  . memantine (NAMENDA) 10 MG tablet Take 10 mg by mouth 2 (two) times daily.  . sitaGLIPtin (JANUVIA) 50 MG tablet Take 50 mg by mouth daily.  . solifenacin (VESICARE) 10 MG tablet Take 10 mg by mouth every evening.  . tamoxifen (NOLVADEX) 20 MG tablet Take 20 mg by mouth every evening.     Allergies: Allergies as of 08/19/2019  . (No Known Allergies)   Past Medical History:  Diagnosis Date  . Cancer ([place])   . Dementia ([place])   . Hypertension     Family History: Could not be obtained 2/2 patient's dementia versus acute AMS  Social History: Could not be obtained 2/2 patient's dementia versus acute AMS Per daughter patient lives at [facility] with wife  Review of Systems: Could not be obtained 2/2 patient's dementia versus acute AMS  Physical Exam: General: Well-appearing, NAD HEENT: no pain palpation posterior neck w/normal ROM. Pulm: CTAB, No r/r/w Cardio: bradycardia, S1, S2, No m/r/g Abdominal: Nontender, nondistended  Neuro: Alert and oriented x 1, no focal deficits, pt can follow limited commands Extremities: moves all four extremities, normal ROM Psych: Speech nonsensical, demented  Assessment & Plan by Problem: Active Problems:   Fall at home Summary: Gary Roy is a 85yo male with a h/o lewy body dementia, hypertension and breast cancer presenting to due to a fall. Pt found to have elevated creatinine kinase in ED; pt being admitted due to concern for possible rhabdomyolysis. CK Elevation  Mechanical Fall: CK elevation most likely transient from laying on floor after fall last night for >6 hours. Fall thought to be mechanical likely 2/2 gait instability from dementia. CT head/neck no acute findings. Concern for rhabdomyolysis. CK 527 on admission now 983,  will continue to trend. Pt at risk for continued falls given h/o falls and AMS.  -Serum CK, 10/13 5AM  -CBC daily  -PT Eval and Treat  -OT Eval and Treat  CKD III: Pt follows with [facility] in [place]. CKD thought 2/2 age-related   Nephrosclerosis. Cr 1.7 on 07/22/19.  -BMP daily Sinus Bradycardia: Pt has a h/o sinus bradycardia. Previously seen by cardiologist who stated pt didn't need pacemaker per documentation from pt's nephrology clinic. Hypertension: slightly hypertensive.   -will hold bp medications overnight Hyperlipidemia: Stable.    -Continue atorvastatin 20mg  qhs. Type II, Diabetes Mellitus: Blood sugar well-controlled on admission. SSI if blood glucose elevated 10/13AM. Lewy Body Dementia: Mental status reportedly stable from previous. Given advanced dementia there is no clear benefit from continuing centrally acting mediations; continued inpatient due to risk of withdrawal. Pt's advancing dementia puts patient at high risk of continued falls. Pt requires hospital bed at home due to increased risk of falls and limited mobility. Family is interested in palliative consult and goals of care discussion.  - palliative care consults - PT/OT  -Start donepezil 10mg  instead of home donepezil 23mg   -Continue memantine 10mg  BID  -Stroke swallow screen  -Strict Is and Os  -Home use DME Hospital bed  DVT PPX: Lovenox 30mg   Code: DNR, confirmed w/pt wife Diet: NPO pending swallow screen  Dispo: Admit patient to Observation with expected length of stay less than 2 midnights.  Signed: Flonnie Hailstone, Medical Student 08/19/2019, 12:55 PM

## 2019-08-19 NOTE — Evaluation (Signed)
Occupational Therapy Evaluation Patient Details Name: Gary Roy MRN: [id number] DOB: [birth year] Today's Date: 08/19/2019    History of Present Illness Pt is an 85 y/o male admitted following unwitnessed fall. CT of head and C spine negative for acute abnormality. PMH includes dementia and HTN.    Clinical Impression   Patient admitted for above and limited by problem list below, including generalized pain, impaired cognition, decreased activity tolerance, generalized weakness and impaired balance.  Pt with hx of dementia, but unclear baseline; pt reports using cane or walker for mobility and completing ADLs without assist, but poor historian with very garbled speech and unable to determine PLOF. He is oriented to name only, follows 1 step commands inconsistently with increased time and has poor awareness.  He requires max +2 for bed mobility, transfers and LB ADLs, max-total assist for UB ADls.  He will benefit from continued OT services while admitted and after dc at [facility] level in order to decreased burden of care and maximize independence/ safety prior to dc home.  If family refuses SNF, he will need maximal [place] services.      Follow Up Recommendations  SNF;Supervision/Assistance - 24 hour    Equipment Recommendations  3 in 1 bedside commode    Recommendations for Other Services Speech consult     Precautions / Restrictions Precautions Precautions: Fall Restrictions Weight Bearing Restrictions: No      Mobility Bed Mobility Overal bed mobility: Needs Assistance Bed Mobility: Supine to Sit;Sit to Supine     Supine to sit: Mod assist;Max assist;+2 for physical assistance Sit to supine: Mod assist;Max assist;+2 for physical assistance   General bed mobility comments: Mod-max A +2 for assist with LEs and trunk elevation. Increased time to perform bed mobility tasks and required multimodal cues.   Transfers Overall transfer level: Needs assistance Equipment used: 2 person  hand held assist Transfers: Sit to/from Stand Sit to Stand: Max assist;+2 physical assistance         General transfer comment: max assist +2 to ascend to partial stand only    Balance Overall balance assessment: Needs assistance Sitting-balance support: No upper extremity supported;Feet supported Sitting balance-Leahy Scale: Fair     Standing balance support: Bilateral upper extremity supported;During functional activity Standing balance-Leahy Scale: Poor Standing balance comment: Reliant on UE and external support.                            ADL either performed or assessed with clinical judgement   ADL Overall ADL's : Needs assistance/impaired                                     Functional mobility during ADLs: Moderate assistance;Maximal assistance;+2 for physical assistance;+2 for safety/equipment General ADL Comments: requires max-total assist for self care ADLs at this time      Vision   Additional Comments: further assessment, limited due to cognition     Perception     Praxis      Pertinent Vitals/Pain Pain Assessment: Faces Faces Pain Scale: Hurts little more Pain Location: generalized with movement  Pain Descriptors / Indicators: Grimacing;Guarding Pain Intervention(s): Limited activity within patient's tolerance;Monitored during session;Repositioned     Hand Dominance     Extremity/Trunk Assessment Upper Extremity Assessment Upper Extremity Assessment: Generalized weakness;Difficult to assess due to impaired cognition   Lower Extremity Assessment Lower Extremity Assessment: Defer to  PT evaluation   Cervical / Trunk Assessment Cervical / Trunk Assessment: Kyphotic   Communication Communication Communication: HOH;Expressive difficulties(garbled speech)   Cognition Arousal/Alertness: Awake/alert Behavior During Therapy: Flat affect Overall Cognitive Status: No family/caregiver present to determine baseline cognitive  functioning                                 General Comments: Pt with hx of dementia at baseline, however, unsure if pt is at baseline. Pt able to answer some questions, however, speech often unintelligible and garbled. follows simple 1 step commands inconsistently with increased time.  oriented to name only (not birthday, time, or place)   General Comments  no family present, VSS     Exercises     Shoulder Instructions      Home Living Family/patient expects to be discharged to:: Unsure                                 Additional Comments: Pt unable to provide home information. When asked questions, speech often garbled.       Prior Functioning/Environment          Comments: Unsure of PLOF given cognitive deficits. Pt's speech often garbled and unintelligible        OT Problem List: Decreased strength;Decreased activity tolerance;Impaired balance (sitting and/or standing);Decreased coordination;Decreased cognition;Decreased safety awareness;Decreased knowledge of use of DME or AE;Decreased knowledge of precautions;Pain      OT Treatment/Interventions: Self-care/ADL training;DME and/or AE instruction;Therapeutic exercise;Therapeutic activities;Patient/family education;Balance training;Cognitive remediation/compensation    OT Goals(Current goals can be found in the care plan section) Acute Rehab OT Goals Patient Stated Goal: none stated OT Goal Formulation: Patient unable to participate in goal setting Time For Goal Achievement: 09/02/19 Potential to Achieve Goals: Fair  OT Frequency: Min 2X/week   Barriers to D/C:            Co-evaluation PT/OT/SLP Co-Evaluation/Treatment: Yes Reason for Co-Treatment: Complexity of the patient's impairments (multi-system involvement);Necessary to address cognition/behavior during functional activity;For patient/therapist safety;To address functional/ADL transfers PT goals addressed during session:  Mobility/safety with mobility;Balance OT goals addressed during session: ADL's and self-care      AM-PAC OT "6 Clicks" Daily Activity     Outcome Measure Help from another person eating meals?: Total Help from another person taking care of personal grooming?: Total Help from another person toileting, which includes using toliet, bedpan, or urinal?: Total Help from another person bathing (including washing, rinsing, drying)?: Total Help from another person to put on and taking off regular upper body clothing?: Total Help from another person to put on and taking off regular lower body clothing?: Total 6 Click Score: 6   End of Session Nurse Communication: Mobility status  Activity Tolerance: Patient tolerated treatment well Patient left: in bed;with call bell/phone within reach  OT Visit Diagnosis: Other abnormalities of gait and mobility (R26.89);Unsteadiness on feet (R26.81);Other symptoms and signs involving cognitive function;Cognitive communication deficit (R41.841);Muscle weakness (generalized) (M62.81)                Time: [id number] OT Time Calculation (min): 18 min Charges:  OT General Charges $OT Visit: 1 Visit OT Evaluation $OT Eval Moderate Complexity: [place], OT Acute Rehabilitation Services Pager [phone] Office [phone]   Delight Stare 08/19/2019, 4:54 PM

## 2019-08-19 NOTE — Progress Notes (Signed)
NEW ADMISSION NOTE New Admission Note:   Arrival Method: stretcher  Mental Orientation: alert  Telemetry: box #6 Assessment: Completed Skin: intact  [id number] infusing  Pain: Tubes: Safety Measures: Safety Fall Prevention Plan has been given, discussed and signed Admission: Completed [facility] Orientation: Patient has been orientated to the room, unit and staff.  Family:  Orders have been reviewed and implemented. Will continue to monitor the patient. Call light has been placed within reach and bed alarm has been activated.   Shela Commons, RN

## 2019-08-19 NOTE — ED Triage Notes (Addendum)
Pt arrives [facility] for a fall at [facility]. Dementia baseline. Wife found him on the floor on his left side with head up against dresser this morning they went to bed at 9 pm last night wife unsure of how long he has been there. Swelling to left eye. C collar on. DM 127 cbg. Denies blood thinners.

## 2019-08-19 NOTE — Progress Notes (Signed)
CSW received a consult for patient due to his need for a hospital bed, RN CM to address needs.  Madilyn Fireman, MSW, LCSW-A Transitions of Care  Clinical Social Worker  [facility] Emergency Departments  Medical ICU [phone]

## 2019-08-20 ENCOUNTER — Other Ambulatory Visit: Payer: Self-pay

## 2019-08-20 ENCOUNTER — Encounter

## 2019-08-20 ENCOUNTER — Ambulatory Visit: Payer: Medicare Other | Admitting: Neurology

## 2019-08-20 DIAGNOSIS — N183 Chronic kidney disease, stage 3 unspecified: Secondary | ICD-10-CM | POA: Diagnosis not present

## 2019-08-20 DIAGNOSIS — I129 Hypertensive chronic kidney disease with stage 1 through stage 4 chronic kidney disease, or unspecified chronic kidney disease: Secondary | ICD-10-CM | POA: Diagnosis present

## 2019-08-20 DIAGNOSIS — D539 Nutritional anemia, unspecified: Secondary | ICD-10-CM | POA: Diagnosis present

## 2019-08-20 DIAGNOSIS — W19XXXA Unspecified fall, initial encounter: Secondary | ICD-10-CM | POA: Diagnosis present

## 2019-08-20 DIAGNOSIS — E1122 Type 2 diabetes mellitus with diabetic chronic kidney disease: Secondary | ICD-10-CM | POA: Diagnosis present

## 2019-08-20 DIAGNOSIS — Y92003 Bedroom of unspecified non-institutional (private) residence as the place of occurrence of the external cause: Secondary | ICD-10-CM | POA: Diagnosis not present

## 2019-08-20 DIAGNOSIS — Z20828 Contact with and (suspected) exposure to other viral communicable diseases: Secondary | ICD-10-CM | POA: Diagnosis present

## 2019-08-20 DIAGNOSIS — Z515 Encounter for palliative care: Secondary | ICD-10-CM

## 2019-08-20 DIAGNOSIS — N1831 Chronic kidney disease, stage 3a: Secondary | ICD-10-CM | POA: Diagnosis present

## 2019-08-20 DIAGNOSIS — E785 Hyperlipidemia, unspecified: Secondary | ICD-10-CM | POA: Diagnosis present

## 2019-08-20 DIAGNOSIS — Z853 Personal history of malignant neoplasm of breast: Secondary | ICD-10-CM | POA: Diagnosis not present

## 2019-08-20 DIAGNOSIS — R748 Abnormal levels of other serum enzymes: Secondary | ICD-10-CM | POA: Diagnosis present

## 2019-08-20 DIAGNOSIS — G3183 Dementia with Lewy bodies: Secondary | ICD-10-CM | POA: Diagnosis present

## 2019-08-20 DIAGNOSIS — N179 Acute kidney failure, unspecified: Secondary | ICD-10-CM | POA: Diagnosis present

## 2019-08-20 DIAGNOSIS — R55 Syncope and collapse: Secondary | ICD-10-CM | POA: Diagnosis not present

## 2019-08-20 DIAGNOSIS — F0281 Dementia in other diseases classified elsewhere with behavioral disturbance: Secondary | ICD-10-CM

## 2019-08-20 DIAGNOSIS — R001 Bradycardia, unspecified: Secondary | ICD-10-CM | POA: Diagnosis present

## 2019-08-20 DIAGNOSIS — Z79899 Other long term (current) drug therapy: Secondary | ICD-10-CM | POA: Diagnosis not present

## 2019-08-20 DIAGNOSIS — Z66 Do not resuscitate: Secondary | ICD-10-CM | POA: Diagnosis present

## 2019-08-20 DIAGNOSIS — Y92009 Unspecified place in unspecified non-institutional (private) residence as the place of occurrence of the external cause: Secondary | ICD-10-CM

## 2019-08-20 DIAGNOSIS — R1310 Dysphagia, unspecified: Secondary | ICD-10-CM

## 2019-08-20 LAB — CBC
HCT: 31.4 % — ABNORMAL LOW (ref 39.0–52.0)
Hemoglobin: 10.3 g/dL — ABNORMAL LOW (ref 13.0–17.0)
MCH: 32.9 pg (ref 26.0–34.0)
MCHC: 32.8 g/dL (ref 30.0–36.0)
MCV: 100.3 fL — ABNORMAL HIGH (ref 80.0–100.0)
Platelets: 133 10*3/uL — ABNORMAL LOW (ref 150–400)
RBC: 3.13 MIL/uL — ABNORMAL LOW (ref 4.22–5.81)
RDW: 13 % (ref 11.5–15.5)
WBC: 6.2 10*3/uL (ref 4.0–10.5)
nRBC: 0 % (ref 0.0–0.2)

## 2019-08-20 LAB — COMPREHENSIVE METABOLIC PANEL
ALT: 16 U/L (ref 0–44)
AST: 35 U/L (ref 15–41)
Albumin: 3.1 g/dL — ABNORMAL LOW (ref 3.5–5.0)
Alkaline Phosphatase: 40 U/L (ref 38–126)
Anion gap: 12 (ref 5–15)
BUN: 23 mg/dL (ref 8–23)
CO2: 18 mmol/L — ABNORMAL LOW (ref 22–32)
Calcium: 8.5 mg/dL — ABNORMAL LOW (ref 8.9–10.3)
Chloride: 112 mmol/L — ABNORMAL HIGH (ref 98–111)
Creatinine, Ser: 1.41 mg/dL — ABNORMAL HIGH (ref 0.61–1.24)
GFR calc Af Amer: 51 mL/min — ABNORMAL LOW (ref >60)
GFR calc non Af Amer: 44 mL/min — ABNORMAL LOW (ref >60)
Glucose, Bld: 71 mg/dL (ref 70–99)
Potassium: 3.8 mmol/L (ref 3.5–5.1)
Sodium: 142 mmol/L (ref 135–145)
Total Bilirubin: 1.2 mg/dL (ref 0.3–1.2)
Total Protein: 5.9 g/dL — ABNORMAL LOW (ref 6.5–8.1)

## 2019-08-20 LAB — GLUCOSE, CAPILLARY
Glucose-Capillary: 49 mg/dL — ABNORMAL LOW (ref 70–99)
Glucose-Capillary: 72 mg/dL (ref 70–99)
Glucose-Capillary: 106 mg/dL — ABNORMAL HIGH (ref 70–99)
Glucose-Capillary: 111 mg/dL — ABNORMAL HIGH (ref 70–99)
Glucose-Capillary: 114 mg/dL — ABNORMAL HIGH (ref 70–99)

## 2019-08-20 LAB — VITAMIN B12: Vitamin B-12: 488 pg/mL (ref 180–914)

## 2019-08-20 LAB — CK: Total CK: 728 U/L — ABNORMAL HIGH (ref 49–397)

## 2019-08-20 MED ORDER — AMLODIPINE BESYLATE 5 MG PO TABS
2.5000 mg | ORAL_TABLET | Freq: Every day | ORAL | Status: DC
  Administered 2019-08-20 – 2019-08-22 (×3): 2.5 mg via ORAL
  Filled 2019-08-20 (×3): qty 1

## 2019-08-20 MED ORDER — LISINOPRIL 10 MG PO TABS
10.0000 mg | ORAL_TABLET | Freq: Every day | ORAL | Status: DC
  Administered 2019-08-20 – 2019-08-22 (×3): 10 mg via ORAL
  Filled 2019-08-20 (×3): qty 1

## 2019-08-20 MED ORDER — TAMOXIFEN CITRATE 10 MG PO TABS
20.0000 mg | ORAL_TABLET | Freq: Every evening | ORAL | Status: DC
  Administered 2019-08-20 – 2019-08-22 (×3): 20 mg via ORAL
  Filled 2019-08-20 (×3): qty 2

## 2019-08-20 MED ORDER — ATORVASTATIN CALCIUM 10 MG PO TABS
20.0000 mg | ORAL_TABLET | Freq: Every day | ORAL | Status: DC
  Administered 2019-08-20 – 2019-08-21 (×2): 20 mg via ORAL
  Filled 2019-08-20 (×2): qty 2

## 2019-08-20 NOTE — Evaluation (Signed)
Clinical/Bedside Swallow Evaluation Patient Details  Name: Gary Roy MRN: [id number] Date of Birth: [birth year]  Today's Date: 08/20/2019 Time: SLP Start Time (ACUTE ONLY): 0840 SLP Stop Time (ACUTE ONLY): 0855 SLP Time Calculation (min) (ACUTE ONLY): 15 min  Past Medical History:  Past Medical History:  Diagnosis Date  . Cancer ([place])   . Dementia ([place])   . Hypertension    Past Surgical History:  Past Surgical History:  Procedure Laterality Date  . BREAST LUMPECTOMY     HPI:  Mr. Gary Roy is a 85yo male with a h/o dementia, hypertension and breast cancer presenting due to a fall.  Per daughter and wife, patient and wife live in assisted living with a caretaker couple hours every day at [facility] at [facility]. He normally walks "a little sometimes." He has had lewy body dementia for 3-7 years, particularly worse in the last 3 years.  Per wife report, pt consumed regular solids and thin liquid at baseline.  No prior documented dysphagia hx.    Assessment / Plan / Recommendation Clinical Impression  Pt presents with oral dysphagia and suspected mild pharyngeal dysphagia, most likely related to cognitive abilities.  Pt was encountered awake/alert in bed and he was pleasantly confused throughout this evaluation.  Pt verbalized some coherent and appropriate phrases such as greetings; however, when asked questions about his PMH he exhibited mostly neologisms and confabulations.  Pt was unable to complete the oral mechanism examination secondary to inability to follow commands in the setting of dementia.  Pt consumed trials of ice chips, thin liquid, puree, and regular solids.  Pt required partial-total assistance for po intake.  He initially was unable to draw liquid from the straw; however, pipette method was used and then pt was able to independently draw liquid.  He exhibited a delayed throat clear following 1/4 thin liquid trials when he took multiple large sips in a row.  No additional  overt s/sx of aspiration were observed with any trials.  Pt exhibited prolonged AP transport and suspected delayed swallow initiation with all consistencies.  During regular solid trial, pt did not attempt mastication or lingual manipulation and the bolus was subsequently removed secondary to aspiration concerns.  Recommend Dysphagia 1 (puree) solids and thin liquids with medications crushed in puree and full supervision/assistance to encourage use of safe swallow strategies (listed below).  SLP will follow up to monitor diet tolerance and to evaluate potential for clinical diet upgrade.    SLP Visit Diagnosis: Dysphagia, unspecified (R13.10)    Aspiration Risk  Mild aspiration risk    Diet Recommendation Dysphagia 1 (Puree);Thin liquid   Liquid Administration via: Cup;Straw Medication Administration: Crushed with puree Supervision: Staff to assist with self feeding;Full supervision/cueing for compensatory strategies Compensations: Minimize environmental distractions;Slow rate;Small sips/bites Postural Changes: Seated upright at 90 degrees    Other  Recommendations Oral Care Recommendations: Oral care BID   Follow up Recommendations Skilled Nursing facility;24 hour supervision/assistance      Frequency and Duration min 2x/week  2 weeks       Prognosis Prognosis for Safe Diet Advancement: Fair Barriers to Reach Goals: Cognitive deficits      Swallow Study   General HPI: Mr. Gary Roy is a 85yo male with a h/o dementia, hypertension and breast cancer presenting due to a fall.  Per daughter and wife, patient and wife live in assisted living with a caretaker couple hours every day at [facility] at [facility]. He normally walks "a little sometimes." He has had  lewy body dementia for 3-7 years, particularly worse in the last 3 years.  Per wife report, pt consumed regular solids and thin liquid at baseline.  No prior documented dysphagia hx.  Type of Study: Bedside Swallow  Evaluation Previous Swallow Assessment: None Diet Prior to this Study: NPO Temperature Spikes Noted: No Respiratory Status: Room air History of Recent Intubation: No Behavior/Cognition: Alert;Pleasant mood;Confused;Requires cueing Oral Cavity Assessment: Within Functional Limits Oral Care Completed by SLP: No Oral Cavity - Dentition: Adequate natural dentition(Slightly poor condition ) Self-Feeding Abilities: Needs assist Patient Positioning: Upright in bed Baseline Vocal Quality: Normal Volitional Swallow: Unable to elicit    Oral/Motor/Sensory Function Overall Oral Motor/Sensory Function: (Unable to evaluate )   Ice Chips Ice chips: Within functional limits   Thin Liquid Thin Liquid: Impaired Presentation: Cup;Straw;Spoon Oral Phase Functional Implications: Prolonged oral transit Pharyngeal  Phase Impairments: Suspected delayed Swallow;Throat Clearing - Delayed    Nectar Thick Nectar Thick Liquid: Not tested   Honey Thick Honey Thick Liquid: Not tested   Puree Puree: Within functional limits Presentation: Spoon   Solid     Solid: Impaired Presentation: Spoon Oral Phase Impairments: Reduced lingual movement/coordination;Poor awareness of bolus;Impaired mastication Oral Phase Functional Implications: Oral holding;Impaired mastication     Bretta Bang, M.S., [place] Acute Rehabilitation Services Office: [phone]  Lillie 08/20/2019,9:12 AM

## 2019-08-20 NOTE — Progress Notes (Signed)
   Subjective: Pt confused during exam. Pt did not endorse any pain or concerns.  Objective:  Vital signs in last 24 hours: Vitals:   08/19/19 1752 08/19/19 2029 08/20/19 0459 08/20/19 0831  BP: 139/63 (!) 158/66 139/83 (!) 144/67  Pulse: 60 (!) 49 (!) 47 (!) 50  Resp:  18 18 15   Temp: 98.7 F (37.1 C) 97.7 F (36.5 C) 97.8 F (36.6 C) 98.3 F (36.8 C)  TempSrc:    Oral  SpO2: 98% 99% 98% 97%  Weight:  (!) 159 kg    Height:       General: NAD, frail elderly man Pulm: CTAB, no r/r/w Card: RRR, s1,s2, no m/r/g  Neuro: No focal deficits, not oriented to self Psych: Speech nonsensical, demented  Assessment/Plan:  Active Problems:   Fall at home   Fall   Elevated CK   AKI (acute kidney injury) ([place])   Stage 3 chronic kidney disease   Fall at home, initial encounter  Summary: Gary Roy is a 85yo malewith a h/o lewy body dementia, hypertension and breast cancerpresenting to due to a fall. Pt found to have elevated creatinine kinase in ED; pt admitted due to concern for possible rhabdomyolysis. After resolution of CK elevation, disposition to SNF planned pending conversation with pt wife and daughter. CK Elevation  Mechanical Fall: CK down-trended. No concern for rhabdomyolysis. Macrocytic Anemia: Pt has h/o macrocytic anemia dating to 03/19. No B12 value found.  -Serum B12 CKD III: Cr improved-1.4 from 1.7.  -BMP daily Sinus Bradycardia: Stable. Hypertension: Slightly hypertensive.   -Restart home amlodipine and lisinopril Hyperlipidemia: Stable.    -Continue atorvastatin 20mg  qhs. Type II, Diabetes Mellitus: Stable.  -SSI sensitive Lewy Body Dementia: Due to ADL deficits, pt would benefit from SNF placement as per PT/OT/SLP recommendations. Social work consulted; Education officer, museum states she has not been able to contact pt wife but has discussed with pt daughter Gary Roy.   -Continue donepezil 10mg   -Continue memantine 10mg  BID  -Dysphagia I diet per SLP recommendations   -Strict Is and Os  DVT PPX: Lovenox 30mg   Code: DNR, confirmed w/pt wife Diet: Dysphagia I, per SLP recommendation  Dispo: Likely discharge to SNF. Spoke with daughter Gary Roy who would like to confirm with mother that discharge to SNF is acceptable.   LOS: 0 days   Flonnie Hailstone, Medical Student 08/20/2019, 1:29 PM

## 2019-08-20 NOTE — TOC Initial Note (Signed)
Transition of Care [facility]) - Initial/Assessment Note    Patient Details  Name: Gary Roy MRN: [id number] Date of Birth: [birth year]  [facility] CM/SW Contact:    Sable Feil, LCSW Phone Number: 08/20/2019, 4:41 PM  Clinical Narrative:  CSW talked with Mrs. Tauer by phone regarding discharge disposition for patient. Wife agreeable to [facility] re[facility] and requests placement at [facility] as they are Catholic and they facility is [facility]. Mrs. Rosie also reported that she knows the head of security at [employer]. Wife gave permission for her daughter [name] to be contacted. CSW also talked with daughter regarding placement. She expressed concern regarding patient being placed do to COVID, but added that it would be her mother's decision. Daughter reported that her dad has a caretaker 3 times a day for 2 hours each visit.                Expected Discharge Plan: Skilled Nursing Facility Barriers to Discharge: SNF Pending bed offer   Patient Goals and CMS Choice Patient states their goals for this hospitalization and ongoing recovery are:: Mrs. Dooleyb agreeable to rehab for patient and her preference for placement - [facility] is important to her as they are [place] and the facility is [employer].gov Compare Post Acute Care list provided to:: Other (Comment Required)(Wife provided her facility preference to CSW via phone) Choice offered to / list presented to : Spouse(CSW talked with wife regarding SNF for patient and she provided her facility preference. Wife did not give permission for patient's information to be sent out to other facilities at this time)  Expected Discharge Plan and Services Expected Discharge Plan: [facility] In-house Referral: Clinical Social Work     Living arrangements for the past 2 months: Apartment                                    Prior Living Arrangements/Services Living arrangements for the  past 2 months: Apartment Lives with:: Spouse Patient language and need for interpreter reviewed:: No Do you feel safe going back to the place where you live?: No(Wife agreeable to [facility] for husband as she is unable to care for patient at home at this time)   Wife in agreement with [facility] as she is unable to care for him at home at this time  Need for Family Participation in Patient Care: Yes (Comment) Care giver support system in place?: Yes (comment)   Criminal Activity/Legal Involvement Pertinent to Current Situation/Hospitalization: No - Comment as needed  Activities of Daily Living      Permission Sought/Granted Permission sought to share information with : Family Supports Permission granted to share information with : Yes, Verbal Permission Granted(Mrs. Bloemker gave permission for CSW to talk wtih her daugher Letitia Libra)  Share Information with NAME: Sharl Ma     Permission granted to share info w Relationship: Daughter  Permission granted to share info w Contact Information: [phone]  Emotional Assessment Appearance:: Other (Comment Required(Did not visit with patient, talked with wife by phone) Attitude/Demeanor/Rapport: Unable to Assess Affect (typically observed): Unable to Assess Orientation: : Oriented to Self Alcohol / Substance Use: Other (comment)(information not provided) Psych Involvement: No (comment)  Admission diagnosis:  Elevated CK [R74.8] AKI (acute kidney injury) ([place]) [N17.9] Fall, initial encounter [W19.XXXA] Patient Active Problem List   Diagnosis Date Noted  . Fall at home, initial encounter 08/20/2019  .  Fall at home 08/19/2019  . Fall   . Elevated CK   . AKI (acute kidney injury) ([place])   . Stage 3 chronic kidney disease    PCP:  Patient, No Pcp Per Pharmacy:   [id number] - [place], [place] [id number] [place] [id number] Phone: [phone]  Fax: [phone]   Social Determinants of Health (SDOH) Interventions  No SDOH interventions needed at this time  Readmission Risk Interventions No flowsheet data found.

## 2019-08-20 NOTE — Consult Note (Signed)
Consultation Note Date: 08/20/2019   Patient Name: Gary Roy  DOB: 01-24-1928  MRN: [id number]  Age / Sex: 85 y.o., male  PCP: Patient, No Pcp Per Referring Physician: Velna Ochs, MD  Reason for Consultation: Establish GOCs and emotional support  HPI/Patient Profile: 85 y.o. male  admitted on 08/19/2019 with   PMH h/o dementia, hypertension and breast canceradmitted through the ER 2/2 to fall.  In ED, CT head showed no acute   Patient and wife live in assisted living with a caretaker couple hours every day at [facility] at [facility].    He has had continue physical, functional and cognitive decline over the past year requiring more assistance    Family face treatment option decisions, advanced directive decisions and anticipatory care needs.  Clinical Assessment and Goals of Care:  This NP Wadie Lessen reviewed medical records, received report from team and then spoke to daughter Gary Roy # [phone] to discuss diagnosis, prognosis, GOC, EOL wishes disposition and options.  I spoke initailly to wife  Gary Roy # [phone] who asked that all conversation be directed to her daughter Gary Roy  Concept of Hospice and Palliative Care were discussed  A discussion was had today regarding advanced directives.  Concepts specific to code status, artifical feeding and hydration, continued IV antibiotics and rehospitalization was had.    The difference between a aggressive medical intervention path  and a palliative comfort care path for this patient at this time was had.  Values and goals of care important to patient and family were attempted to be elicited.  Gary Roy is quite aware and pragmatic regarding her parents current situation. As a family they are active in the planning and looking at future steps.  Open to Palliative services at facility on discharge  Questions and  concerns addressed.   Family encouraged to call with questions or concerns.    PMT will continue to support holistically.   HPOA is both wife and daughter.  Encouraged family to bring in copy for scanning.   SUMMARY OF RECOMMENDATIONS    Code Status/Advance Care Planning:  DNR/DNI - no artifiacail feeding now or in the future    Additional Recommendations (Limitations, Scope, Preferences):  Open to interventions to medically maximize patient for discharge  Psycho-social/Spiritual:   Desire for further Chaplaincy support:declined  Additional Recommendations: education on Hospice  Created space and opportuity for daughter to explore thoughts and feelings regarding her parent's current situation   Prognosis:   Unable to determine at this time  Discharge Planning: Back to [facility] with palliative services      Primary Diagnoses: Present on Admission: **None**   I have reviewed the medical record, interviewed the patient and family, and examined the patient. The following aspects are pertinent.  Past Medical History:  Diagnosis Date  . Cancer ([place])   . Dementia ([place])   . Hypertension    Social History   Socioeconomic History  . Marital status: Married    Spouse name: Not on file  . Number of  children: Not on file  . Years of education: Not on file  . Highest education level: Not on file  Occupational History  . Not on file  Social Needs  . Financial resource strain: Not on file  . Food insecurity    Worry: Not on file    Inability: Not on file  . Transportation needs    Medical: Not on file    Non-medical: Not on file  Tobacco Use  . Smoking status: Not on file  Substance and Sexual Activity  . Alcohol use: Not on file  . Drug use: Not on file  . Sexual activity: Not on file  Lifestyle  . Physical activity    Days per week: Not on file    Minutes per session: Not on file  . Stress: Not on file  Relationships  . Social Herbalist  on phone: Not on file    Gets together: Not on file    Attends religious service: Not on file    Active member of club or organization: Not on file    Attends meetings of clubs or organizations: Not on file    Relationship status: Not on file  Other Topics Concern  . Not on file  Social History Narrative  . Not on file   No family history on file. Scheduled Meds: . donepezil  10 mg Oral QHS  . enoxaparin (LOVENOX) injection  30 mg Subcutaneous Q24H  . insulin aspart  0-9 Units Subcutaneous TID WC  . memantine  10 mg Oral BID  . pantoprazole  40 mg Oral Daily   Continuous Infusions: . sodium chloride 75 mL/hr at 08/20/19 0438   PRN Meds:.acetaminophen **OR** acetaminophen, senna-docusate Medications Prior to Admission:  Prior to Admission medications   Medication Sig Start Date End Date Taking? Authorizing Provider  amLODipine (NORVASC) 5 MG tablet Take 2.5 mg by mouth daily.   Yes [provider]  atorvastatin (LIPITOR) 20 MG tablet Take 20 mg by mouth at bedtime.   Yes [provider]  donepezil (ARICEPT) 23 MG TABS tablet Take 23 mg by mouth every evening.   Yes [provider]  esomeprazole (NEXIUM) 40 MG capsule Take 40 mg by mouth daily at 12 noon.   Yes [provider]  lisinopril (ZESTRIL) 20 MG tablet Take 10 mg by mouth daily.   Yes [provider]  memantine (NAMENDA) 10 MG tablet Take 10 mg by mouth 2 (two) times daily.   Yes [provider]  sitaGLIPtin (JANUVIA) 50 MG tablet Take 50 mg by mouth daily.   Yes [provider]  solifenacin (VESICARE) 10 MG tablet Take 10 mg by mouth every evening.   Yes [provider]  tamoxifen (NOLVADEX) 20 MG tablet Take 20 mg by mouth every evening.   Yes [provider]   No Known Allergies Review of Systems  Physical Exam  Vital Signs: BP (!) 144/67 (BP Location: Right Arm)   Pulse (!) 50   Temp 98.3 F (36.8 C) (Oral)   Resp 15   Ht 5\' 5"   (1.651 m)   Wt (!) 159 kg   SpO2 97%   BMI 58.33 kg/m  Pain Scale: Faces       SpO2: SpO2: 97 % O2 Device:SpO2: 97 % O2 Flow Rate: .   IO: Intake/output summary:   Intake/Output Summary (Last 24 hours) at 08/20/2019 1026 Last data filed at 08/20/2019 0601 Gross per 24 hour  Intake 1327.95 ml  Output 500 ml  Net 827.95 ml    LBM: Last BM Date: 08/19/19 Baseline Weight: Weight: 64.9 kg Most recent weight: Weight: (!) 159 kg     Palliative Assessment/Data:   Discussed with bedside RN  The above conversation was completed via telephone due to the restrictions during the COVID-19 pandemic. Thorough chart review and discussion with necessary members of the care team was completed as part of assessment. All issues were discussed and addressed but no physical exam was performed.  Time In: 1000 Time Out:1110 Time Total: 70 minutes Greater than 50%  of this time was spent counseling and coordinating care related to the above assessment and plan.  Signed by: Wadie Lessen, NP   Please contact Palliative Medicine Team phone at [phone] for questions and concerns.  For individual provider: See Shea Evans

## 2019-08-20 NOTE — NC FL2 (Signed)
[place] LEVEL OF CARE SCREENING TOOL     IDENTIFICATION  Patient Name: Gary Roy Birthdate: 04-23-1928 Sex: male Admission Date (Current Location): 08/19/2019  [facility] and [place] Number:  Herbalist and Address:  [facility] [place] [address] [place], [place] [address]      Provider Number: [id number]  Attending Physician Name and Address:  Velna Ochs, MD  Relative Name and Phone Number:  Cypher Lowell [phone]; Daughter Letitia Libra - [phone]    Current Level of Care: Hospital Recommended Level of Care: [place] Prior Approval Number:    Date Approved/Denied:   PASRR Number: [id number](Eff. 08/20/19)  Discharge Plan: SNF    Current Diagnoses: Patient Active Problem List   Diagnosis Date Noted  . Fall at home, initial encounter 08/20/2019  . Fall at home 08/19/2019  . Fall   . Elevated CK   . AKI (acute kidney injury) ([place])   . Stage 3 chronic kidney disease     Orientation RESPIRATION BLADDER Height & Weight     Self  Normal Incontinent, External catheter(Placed 08/19/19) Weight: (!) 350 lb 8.5 oz (159 kg) Height:  5\' 5"  (165.1 cm)  BEHAVIORAL SYMPTOMS/MOOD NEUROLOGICAL BOWEL NUTRITION STATUS      Continent Diet(DYS 1)  AMBULATORY STATUS COMMUNICATION OF NEEDS Skin   Total Care(Patient was unable to ambulate with PT) Verbally(Patient's speech is garbled)                         Personal Care Assistance Level of Assistance  Bathing, Feeding, Dressing Bathing Assistance: Maximum assistance Feeding assistance: Maximum assistance Dressing Assistance: Maximum assistance     Functional Limitations Info  Sight, Hearing, Speech Sight Info: Adequate Hearing Info: Adequate Speech Info: Impaired(Speech garbled)    SPECIAL CARE FACTORS FREQUENCY  PT (By licensed PT), OT (By licensed OT)     PT Frequency: Evaluated at hospital 10/12. PT at SNF Eval and Treat;  minimum of 5 days per week OT Frequency: Evaluated at hospital 10/12. OT at SNF Eval and Treat; minimum of 5 days per week            Contractures Contractures Info: Not present    Additional Factors Info  Code Status, Allergies, Insulin Sliding Scale Code Status Info: DNR Allergies Info: No known allergies   Insulin Sliding Scale Info: 0-9 Units - 3 times daily with meals       Current Medications (08/20/2019):  This is the current hospital active medication list Current Facility-Administered Medications  Medication Dose Route Frequency Provider Last Rate Last Dose  . 0.9 %  sodium chloride infusion   Intravenous Continuous Harvie Heck, MD 75 mL/hr at 08/20/19 0438    . acetaminophen (TYLENOL) tablet 650 mg  650 mg Oral Q6H PRN Seawell, Jaimie A, DO       Or  . acetaminophen (TYLENOL) suppository 650 mg  650 mg Rectal Q6H PRN Seawell, Jaimie A, DO      . amLODipine (NORVASC) tablet 2.5 mg  2.5 mg Oral Daily Harbrecht, Lawrence, MD      . atorvastatin (LIPITOR) tablet 20 mg  20 mg Oral QHS Harbrecht, Lawrence, MD      . donepezil (ARICEPT) tablet 10 mg  10 mg Oral QHS Seawell, Jaimie A, DO   10 mg at 08/19/19 2140  . enoxaparin (LOVENOX) injection 30 mg  30 mg Subcutaneous Q24H Seawell, Jaimie A, DO   30 mg at  08/19/19 2140  . insulin aspart (novoLOG) injection 0-9 Units  0-9 Units Subcutaneous TID WC Seawell, Jaimie A, DO      . lisinopril (ZESTRIL) tablet 10 mg  10 mg Oral Daily Harbrecht, Lawrence, MD      . memantine [facility]) tablet 10 mg  10 mg Oral BID Seawell, Jaimie A, DO   10 mg at 08/20/19 1000  . pantoprazole (PROTONIX) EC tablet 40 mg  40 mg Oral Daily Seawell, Jaimie A, DO   40 mg at 08/20/19 0755  . senna-docusate (Senokot-S) tablet 1 tablet  1 tablet Oral QHS PRN Seawell, Jaimie A, DO      . tamoxifen (NOLVADEX) tablet 20 mg  20 mg Oral QPM Kathi Ludwig, MD         Discharge Medications: Please see discharge summary for a list of discharge  medications.  Relevant Imaging Results:  Relevant Lab Results:   Additional Information ss#[id number]  Sable Feil, LCSW

## 2019-08-21 DIAGNOSIS — G3183 Dementia with Lewy bodies: Secondary | ICD-10-CM

## 2019-08-21 DIAGNOSIS — Z515 Encounter for palliative care: Secondary | ICD-10-CM

## 2019-08-21 DIAGNOSIS — Z66 Do not resuscitate: Secondary | ICD-10-CM

## 2019-08-21 DIAGNOSIS — F0281 Dementia in other diseases classified elsewhere with behavioral disturbance: Secondary | ICD-10-CM

## 2019-08-21 LAB — GLUCOSE, CAPILLARY
Glucose-Capillary: 91 mg/dL (ref 70–99)
Glucose-Capillary: 163 mg/dL — ABNORMAL HIGH (ref 70–99)
Glucose-Capillary: 104 mg/dL — ABNORMAL HIGH (ref 70–99)
Glucose-Capillary: 132 mg/dL — ABNORMAL HIGH (ref 70–99)

## 2019-08-21 LAB — BASIC METABOLIC PANEL
Anion gap: 7 (ref 5–15)
BUN: 18 mg/dL (ref 8–23)
CO2: 22 mmol/L (ref 22–32)
Calcium: 8.3 mg/dL — ABNORMAL LOW (ref 8.9–10.3)
Chloride: 112 mmol/L — ABNORMAL HIGH (ref 98–111)
Creatinine, Ser: 1.15 mg/dL (ref 0.61–1.24)
GFR calc Af Amer: >60 mL/min (ref >60)
GFR calc non Af Amer: 56 mL/min — ABNORMAL LOW (ref >60)
Glucose, Bld: 106 mg/dL — ABNORMAL HIGH (ref 70–99)
Potassium: 3.4 mmol/L — ABNORMAL LOW (ref 3.5–5.1)
Sodium: 141 mmol/L (ref 135–145)

## 2019-08-21 LAB — CBC
HCT: 30 % — ABNORMAL LOW (ref 39.0–52.0)
Hemoglobin: 10.1 g/dL — ABNORMAL LOW (ref 13.0–17.0)
MCH: 33 pg (ref 26.0–34.0)
MCHC: 33.7 g/dL (ref 30.0–36.0)
MCV: 98 fL (ref 80.0–100.0)
Platelets: 137 10*3/uL — ABNORMAL LOW (ref 150–400)
RBC: 3.06 MIL/uL — ABNORMAL LOW (ref 4.22–5.81)
RDW: 12.6 % (ref 11.5–15.5)
WBC: 7.2 10*3/uL (ref 4.0–10.5)
nRBC: 0 % (ref 0.0–0.2)

## 2019-08-21 LAB — FOLATE: Folate: 10.1 ng/mL (ref >5.9)

## 2019-08-21 MED ORDER — QUETIAPINE FUMARATE 25 MG PO TABS
25.0000 mg | ORAL_TABLET | Freq: Every day | ORAL | Status: DC
  Administered 2019-08-21: 25 mg via ORAL
  Filled 2019-08-21: qty 1

## 2019-08-21 MED ORDER — ENOXAPARIN SODIUM 40 MG/0.4ML ~~LOC~~ SOLN
40.0000 mg | SUBCUTANEOUS | Status: DC
  Administered 2019-08-21: 40 mg via SUBCUTANEOUS
  Filled 2019-08-21: qty 0.4

## 2019-08-21 NOTE — Progress Notes (Signed)
Spoke with wife regarding discharge plan, wife resides in [facility] and understands patient will likely need long term care following rehab. She would like for the patient to either remain at [facility] or return to [facility].

## 2019-08-21 NOTE — Discharge Summary (Addendum)
Name: Gary Roy MRN: [id number] DOB: 13-Aug-1928 85 y.o. PCP: Patient, No Pcp Per  Date of Admission: 08/19/2019  8:14 AM Date of Discharge: 08/22/2019 Attending Physician: Velna Ochs, MD  Discharge Diagnosis: 1. Mechanical Fall   Lewy Body Dementia 2. Macrocytic Anemia 3. CKD III 4. Sinus Bradycardia 5. Type II, Diabetes Mellitus 6. Hyperlipidemia  Discharge Medications: Allergies as of 08/22/2019   No Known Allergies     Medication List    STOP taking these medications   sitaGLIPtin 50 MG tablet Commonly known as: JANUVIA     TAKE these medications   amLODipine 5 MG tablet Commonly known as: NORVASC Take 2.5 mg by mouth daily.   atorvastatin 20 MG tablet Commonly known as: LIPITOR Take 20 mg by mouth at bedtime.   donepezil 10 MG tablet Commonly known as: ARICEPT Take 1 tablet (10 mg total) by mouth at bedtime. What changed:   medication strength  how much to take  when to take this   esomeprazole 40 MG capsule Commonly known as: NEXIUM Take 40 mg by mouth daily at 12 noon.   lisinopril 20 MG tablet Commonly known as: ZESTRIL Take 10 mg by mouth daily.   memantine 10 MG tablet Commonly known as: NAMENDA Take 10 mg by mouth 2 (two) times daily.   solifenacin 10 MG tablet Commonly known as: VESICARE Take 10 mg by mouth every evening.   tamoxifen 20 MG tablet Commonly known as: NOLVADEX Take 20 mg by mouth every evening.            Durable Medical Equipment  (From admission, onward)         Start     Ordered   08/22/19 1054  For home use only DME Hospital bed  Once    Question Answer Comment  Length of Need Lifetime   Patient has (list medical condition): debility, dementia, rhabdomylosis   The above medical condition requires: Patient requires the ability to reposition frequently   Head must be elevated greater than: 30 degrees   Bed type Semi-electric   Support Surface: Gel Overlay      08/22/19 1053   08/19/19  1359  For home use only DME Hospital bed  Once    Question Answer Comment  Length of Need Lifetime   Patient has (list medical condition): Dementia   Head must be elevated greater than: 45 degrees   Bed type Semi-electric   Support Surface: Low Air loss Mattress      08/19/19 1359          Disposition and follow-up:   GaryTremon Roy was discharged from [facility] in Stable condition.  At the hospital follow up visit please address: 1. A. Mechanical Fall   Lewy Body Dementia-Thought fall mechanical 2/2 advanced Lewy body dementia. Ensure pt's family has adequate resources at home to minimize risk of recurrent falls. SLP recommended dysphagia III diet. Pt discharged with DME hospital bed. Pt had nonsensical, garbled speech during admission. Donepezil decreased from 23mg  daily to 10mg  daily. Nonsensical/garbled speech improved at discharge. Pt daughter reported outpatient neurology appt at discharge. Consider appropriate dosage of memantine and donepezil; cognitive function appeared to improve after lowering donepezil dosage. B. Macrocytic Anemia- Stable. First detected on 03/19. Normal B12 and Folate. Consider serum methylmalonic acid and additional workup. C. CKD III- Stable during hospitalization. H/o age-related nephrosclerosis. Follows outpatient w/nephrology. D. Sinus Bradycardia-Pt has long history of sinus bradycardia. Pt in persistent sinus bradycardia on telemetry and ECGs  obtained during hospitalization. E. Type II, Diabetes Mellitus-Blood sugars within target range with minimal coverage with sliding scale insulin. Pt previously on Januvia. Januvia held at discharge. Consider restarting if blood sugar elevated at follow up. F. Hyperlipidemia- Stable. Continued on home atorvastatin.  2.  Labs / imaging needed at time of follow-up: As indicated based on clinical presentation. Consider additional workup related to macrocytic anemia.  Follow-up  Appointments: -Follow-up appointment w/new PCP, Oct 22 (confirmed by Jackalyn Lombard) Dr. Skakle, [place] neurology appointment, November 23 (confirmed by Jackalyn Lombard) Dr. Margette Fast, [facility] Course by problem list: Summary:Gary Roy is a 36yomalewith ah/olewy bodydementia, hypertension and breast cancerpresenting due to a fall. Pt found to haveelevated CK in ED; pt admitted due to concern for possible rhabdomyolysis. After resolution of CK elevation, disposition to SNF was planned. Pt family declined SNF after placement due to restricted visiting privileges. CK Elevation  Mechanical Fall:Pt presented due to a new fall. Pt was found down at 7am on 10/12 beside his bed. He was last known to be in the bed 7 hours prior. CT head and neck ruled out intracranial bleed and fracture. Pt was admitted due to concern for rhabdomyolysis as CK was elevated to 500 in the ED. CK down-trended o/n following admission. Pt continues to have increased fall risk from Lewy body dementia. Pt discharged with home hospital bed due to increased care needs at home. Macrocytic Anemia: Pt has a h/o of macrocytic anemia that has been stable since 11/18. Pt had a normal folate and B12 during hospitalization. Further workup deferred. CKD III: Cr improved during hospitalization 2/2 IV fluids. Pt follows with [facility] in [place]. CKD thought 2/2 age-related nephrosclerosis and prerenal azotemia. Cr 1.7 on 07/22/19. Sinus Bradycardia: Stable. Hypertension:Slightly hypertensive during hospitalization. Home amlodipine and lisinopril continued. Hyperlipidemia:Stable. Home atorvastatin 20mg  qhs continued. Type II, Diabetes Mellitus:Well-controlled during hospitalization without SSI. Home Januvia held at discharge. Lewy BodyDementia:Due to ADL deficits, pt would benefit from SNF placement as per PT/OT/SLP recommendations. Pt family declined SNF placement as  visitation restricted due to [place]. Pt discharged on donepezil 10mg  and home memantine 10mg  BID.  Discharge Vitals:   BP (!) 141/61 (BP Location: Left Arm)    Pulse (!) 49    Temp 98.5 F (36.9 C) (Oral)    Resp 18    Ht 5\' 5"  (1.651 m)    Wt (!) 159 kg    SpO2 98%    BMI 58.33 kg/m   Pertinent Labs, Studies, and Procedures: CBC Latest Ref Rng & Units 08/21/2019 08/20/2019 08/19/2019  WBC 4.0 - 10.5 K/uL 7.2 6.2 8.2  Hemoglobin 13.0 - 17.0 g/dL 10.1(L) 10.3(L) 11.3(L)  Hematocrit 39.0 - 52.0 % 30.0(L) 31.4(L) 35.4(L)  Platelets 150 - 400 K/uL 137(L) 133(L) 151    BMP Latest Ref Rng & Units 08/21/2019 08/20/2019 08/19/2019  Glucose 70 - 99 mg/dL 106(H) 71 75  BUN 8 - 23 mg/dL 18 23 24(H)  Creatinine 0.61 - 1.24 mg/dL 1.15 1.41(H) 1.36(H)  Sodium 135 - 145 mmol/L 141 142 141  Potassium 3.5 - 5.1 mmol/L 3.4(L) 3.8 3.8  Chloride 98 - 111 mmol/L 112(H) 112(H) 113(H)  CO2 22 - 32 mmol/L 22 18(L) 18(L)  Calcium 8.9 - 10.3 mg/dL 8.3(L) 8.5(L) 8.5(L)   Lab Results  Component Value Date   VITAMINB12 488 08/20/2019   Lab Results  Component Value Date   FOLATE 10.1 08/21/2019   Discharge Instructions:  It was a pleasure to  take care of you!  You were admitted due to a fall. The fall is thought to be related to advanced dementia. You are at an increased risk of continued falls due to your dementia. Following up with a primary care physician as soon as possible within the next couple weeks in addition to a neurologist will be helpful in managing the dementia and determining what other services are available to cope with impaired mobility and other difficulties. Your swallow was evaluated while in the hospital. It was determined that you are at increased risk of aspirating food (food entering the lung while eating) with regular foods. You should restrict your diet as follows .  Foods you can eat:  Well-moistened breads. You may need to add jelly, butter, or other soft spread.  All  well-moistened cereals  Moist desserts that dont contain nuts, seeds, or dried fruit  All canned and cooked fruits, soft and peeled fresh fruit  Tender, thin-sliced meats and fish  Any kind of eggs  Yogurt  Rice, noodles, and most cooked potatoes  Most soups  Cottage cheese, tofu, and well-cooked beans  All cooked, tender vegetables  Most non-chewy candies Foods to avoid:  Dry bread, crackers, and tough breads  Very coarse cereals or dry cereals  Cookies or cakes that are very dry or that contain nuts, dried fruit, seeds, or other hard pieces  Fresh fruit that is hard to chew  Dry or tough meats  Chunky peanut butter  Potato skins, potato chips, corn, or popcorn  Most raw vegetables  Nuts, seeds, or coconut  Very sticky foods, like chewy candies  If you have any additional issues, please contact your primary care physician or return to the closest emergency department.   Signed: Flonnie Hailstone, Medical Student 08/21/2019, 9:01 AM    Attestation for Student Documentation:  I personally was present and performed or re-performed the history, physical exam and medical decision-making activities of this service and have verified that the service and findings are accurately documented in the students note.  Naoko Diperna A, DO 08/24/2019, 2:37 PM

## 2019-08-21 NOTE — Progress Notes (Signed)
   Subjective: Pt somnolent on exam. Pt denies pain or concerns.  Objective:  Vital signs in last 24 hours: Vitals:   08/20/19 0831 08/20/19 1800 08/20/19 2131 08/21/19 0448  BP: (!) 144/67  134/65 (!) 152/64  Pulse: (!) 50 (!) 50 (!) 58 (!) 45  Resp: 15 18 18 18   Temp: 98.3 F (36.8 C) 97.6 F (36.4 C) 97.7 F (36.5 C) 97.7 F (36.5 C)  TempSrc: Oral Oral Oral   SpO2: 97%  98%   Weight:      Height:       General: Frail elderly man, NAD Pulm: CTAB, r/r/w Card: RRR, S1, S2, no m/r/g Neuro: Not oriented to self, no focal deficits Psych: Nonsensical speech, demented  Assessment/Plan:  Active Problems:   Fall at home   Fall   Elevated CK   AKI (acute kidney injury) ([place])   Stage 3 chronic kidney disease   Fall at home, initial encounter  Summary: Mr. Gary Roy is a 85yo malewith a h/o lewy body dementia, hypertension and breast cancerpresenting due to a fall. Pt found to have elevated CK in ED; pt admitted due to concern for possible rhabdomyolysis. After resolution of CK elevation, disposition to SNF was planned. Discharge to [facility] pending bed availability. Mechanical Fall: Pt remains an increased fall risk due to AMS 2/2 advanced lewy body dementia. Macrocytic Anemia: Hgb has been down-trending since admission likely 2/2 phlebotomy. Cause of macrocytosis unclear; pt has normal B12 and folate. Additional workup can be considered outpatient. Given normal B12, there is likely no correlation of macrocytosis with chronic AMS. CKD III: Cr improved-1.15 from 1.4. Sinus Bradycardia: Stable. -EKG Hypertension: BP slightly elevated.   -Continue home amlodipine and lisinopril Hyperlipidemia: Stable.    -Continue atorvastatin 20mg  qhs. Type II, Diabetes Mellitus: Stable.  -SSI sensitive Lewy Body Dementia: Mental status stable. Discharge to SNF pending bed availability at [facility].  -Continue donepezil 10mg   -Continue memantine 10mg  BID  -Dysphagia I diet per SLP  recommendations  -Strict Is and Os  DVT PPX: Lovenox 30mg   Code: DNR, confirmed w/pt wife Diet: Dysphagia I, per SLP recommendation  Dispo: Discharge to SNF [facility]) pending bed availability   LOS: 1 day   Flonnie Hailstone, Medical Student 08/21/2019, 7:16 AM

## 2019-08-21 NOTE — TOC Progression Note (Addendum)
Transition of Care [facility]) - Progression Note    Patient Details  Name: Gary Roy MRN: [id number] Date of Birth: 1928-06-14  Transition of Care [facility]) CM/SW Contact  Sharlet Salina Mila Homer, LCSW Phone Number: 08/21/2019, 2:41 PM  Clinical Narrative: Patient in need of ST rehab and [facility] (wife's first choice) can take him. Talked with Mrs. Borchard this morning and let her know that [facility] can take her husband. Call received from [place] (1:01 pm) admissions director at [facility] wanting to know what is the plan for patient after e   Rehab as he is total care. She asked if patient needs LTC can wife pay privately.  Messages left for wife and daughter Letitia Libra to call CSW. Talked with daughter [name] (2:30 pm) regarding [employer] and their concern that patient will need LTC post rehab and if her mother can afford to pay privately. Call had been made to [facility] before talking with daughter (could not reach Mrs. Nitchman) and the rates are: $275 per day private room and $235 per day semi-private room. This information was shared with daughter and she will try to reach her mom and give her this information. Daughter informed that her dad is medically ready for discharge.  3:50 pm - Talked with Irine Seal, admissions director at [facility] and provided update regarding attempts to reach wife and that contact was made with daughter and she is also trying to reach her mother.   Expected Discharge Plan: Skilled Nursing Facility Barriers to Discharge: SNF Pending bed offer  Expected Discharge Plan and Services Expected Discharge Plan: [place] In-house Referral: Clinical Social Work     Living arrangements for the past 2 months: Apartment                                       Social Determinants of Health (SDOH) Interventions  No SDOH interventions needed at this time  Readmission Risk Interventions No flowsheet data found.

## 2019-08-22 DIAGNOSIS — Z7984 Long term (current) use of oral hypoglycemic drugs: Secondary | ICD-10-CM

## 2019-08-22 LAB — GLUCOSE, CAPILLARY
Glucose-Capillary: 92 mg/dL (ref 70–99)
Glucose-Capillary: 143 mg/dL — ABNORMAL HIGH (ref 70–99)
Glucose-Capillary: 69 mg/dL — ABNORMAL LOW (ref 70–99)
Glucose-Capillary: 94 mg/dL (ref 70–99)

## 2019-08-22 LAB — SARS CORONAVIRUS 2 (TAT 6-24 HRS): SARS Coronavirus 2: NEGATIVE

## 2019-08-22 MED ORDER — DONEPEZIL HCL 10 MG PO TABS: 10.0000 mg | ORAL_TABLET | Freq: Every day | ORAL | 0 refills | Status: DC

## 2019-08-22 NOTE — Progress Notes (Addendum)
   Subjective: Pt alert but doesn't remember why he is in the hospital. Pt reports no pain or additional concerns.  Objective:  Vital signs in last 24 hours: Vitals:   08/21/19 1500 08/21/19 1606 08/21/19 2128 08/22/19 0537  BP:  109/68 (!) 143/66 (!) 139/51  Pulse:  (!) 50 (!) 53 (!) 48  Resp:  18 14 16   Temp:  98.3 F (36.8 C) 100.2 F (37.9 C) 99.2 F (37.3 C)  TempSrc:  Oral Oral Oral  SpO2:  98% 96%   Weight: 73 kg     Height:       General: NAD, frail elderly man Pulm: CTAB, no r/r/w Card: RRR, s1, s2, no m/r/g Neuro: Alert and oriented x 2, no focal deficits Psych: Some nonsensical speech, demented Assessment/Plan:  Active Problems:   Fall at home   Fall   Elevated CK   AKI (acute kidney injury) ([place])   Stage 3 chronic kidney disease   Fall at home, initial encounter   Palliative care by specialist   DNR (do not resuscitate)   Lewy body dementia with behavioral disturbance [facility])  Summary: Gary Roy is a 85yo malewith a h/o lewy body dementia, hypertension and breast cancerpresenting due to a fall. Pt found to have elevated CK in ED; pt admitted due to concern for possible rhabdomyolysis. After resolution of CK elevation, disposition to SNF was planned. Pt family declined SNF after placement due to restricted visiting privileges. Mechanical Fall: Pt remains an increased fall risk due to AMS 2/2 advanced lewy body dementia. Macrocytic Anemia: Stable. Consider additional workup outpatient. CKD III: Stable. Sinus Bradycardia: EKG and telemetry demonstrated sinus bradycardia. Pt has a h/o sinus bradycardia with no interventions previously recommended.   -Telemetry discontinued Hypertension: BP slightly elevated.   -Continue home amlodipine and lisinopril Hyperlipidemia: Stable.    -Continue atorvastatin 20mg  qhs. Type II, Diabetes Mellitus: Stable.  -SSI sensitive Lewy Body Dementia: Mental status stable.  -Continue donepezil 10mg   -Continue memantine 10mg   BID  -Dysphagia 3 diet per SLP recommendations  -Strict Is and Os  -DME hospital bed  DVT PPX: Lovenox 30mg   Code: DNR, confirmed w/pt wife Diet: Dysphagia 3, per SLP recommendation  Dispo: Discharge home after home hospital bed obtained.   LOS: 2 days   Flonnie Hailstone, Medical Student 08/22/2019, 10:28 AM

## 2019-08-22 NOTE — Progress Notes (Signed)
Patient discharge to home, left the floor with PTAR, wife notified, v/s stable

## 2019-08-22 NOTE — Progress Notes (Signed)
  Speech Language Pathology Treatment: Dysphagia  Patient Details Name: Gary Roy MRN: [id number] DOB: [birth year] Today's Date: 08/22/2019 Time: [id number] SLP Time Calculation (min) (ACUTE ONLY): 15 min  Assessment / Plan / Recommendation Clinical Impression  Patient seen at the end of his breakfast meal. When asked how was breakfast, he responded "It didn't taste very good". His CNA reported he tolerated diet well with no coughing. His voice quality is clear and strong. Pt tolerated trials of soft solids and thin liquids. He demonstrated complete mastication of solids, good bolus control and timely swallow initiation. No s/sx of aspiration with any consistency. He was able to feed himself after assistance with set up. Recommend a diet upgrade of Dys 3, Mechanical soft, thin liquids. Full supervision for meals to ensure slow rate, small sips and bites. Recommend ST at next level of care to ensure diet tolerance.    HPI HPI: Gary Roy is a 85yo male with a h/o dementia, hypertension and breast cancer presenting due to a fall.  Per daughter and wife, patient and wife live in assisted living with a caretaker couple hours every day at [employer] at [facility]. He normally walks "a little sometimes." He has had lewy body dementia for 3-7 years, particularly worse in the last 3 years.  Per wife report, pt consumed regular solids and thin liquid at baseline.  No prior documented dysphagia hx.       SLP Plan  Continue with current plan of care       Recommendations  Diet recommendations: Dysphagia 3 (mechanical soft);Thin liquid Liquids provided via: Cup Medication Administration: Whole meds with puree Compensations: Minimize environmental distractions;Slow rate;Small sips/bites                Oral Care Recommendations: Oral care BID Follow up Recommendations: Skilled Nursing facility;24 hour supervision/assistance SLP Visit Diagnosis: Dysphagia, unspecified (R13.10) Plan: Continue  with current plan of care       [place], MA, CCC-SLP 08/22/2019 10:20 AM

## 2019-08-22 NOTE — Progress Notes (Signed)
Physical Therapy Treatment Patient Details Name: Gary Roy MRN: [id number] DOB: 1928/01/08 Today's Date: 08/22/2019    History of Present Illness Pt is an 85 y/o male admitted following unwitnessed fall. CT of head and C spine negative for acute abnormality. PMH includes dementia and HTN.     PT Comments    Pt found in bed having had removed his gown. Pt responds to his name and answers some questions appropriately and other times answers are unintelligible. Pt agreeable to getting out of bed. Pt requires modA for bed mobility and max-modA for transfer to chair using Stedy. Pt lunch in room and he is eager to eat. RN notifies so assistance can be provided. D/c plans remain appropriate. PT will continue to follow acutely.    Follow Up Recommendations  SNF;Supervision/Assistance - 24 hour(IF family refuses, max HH services)     Equipment Recommendations  Wheelchair (measurements PT);Wheelchair cushion (measurements PT);Hospital bed;Other (comment)(hoyer lift; hoyer lift pad )       Precautions / Restrictions Precautions Precautions: Fall Restrictions Weight Bearing Restrictions: No    Mobility  Bed Mobility Overal bed mobility: Needs Assistance Bed Mobility: Supine to Sit;Sit to Supine     Supine to sit: Mod assist;+2 for physical assistance     General bed mobility comments: modA for management of LE off bed, trunk to upright and pad scoot of hips to EoB  Transfers Overall transfer level: Needs assistance   Transfers: Sit to/from Stand           General transfer comment: pt given education on Stedy use and hand and foot placement, on first attempt pt started to stand and when assist give pt confused and unable to attain fully upright for Stedy pad placement. On 2nd attempt pt told to stand and was able to achieve almost upright on his own and only required modA for hip elevation for pad placement.         Balance Overall balance assessment: Needs  assistance Sitting-balance support: No upper extremity supported;Feet supported Sitting balance-Leahy Scale: Fair     Standing balance support: Bilateral upper extremity supported;During functional activity Standing balance-Leahy Scale: Poor Standing balance comment: Reliant on UE and external support.                             Cognition Arousal/Alertness: Awake/alert Behavior During Therapy: Flat affect Overall Cognitive Status: No family/caregiver present to determine baseline cognitive functioning                                 General Comments: Pt with hx of dementia at baseline, however, unsure if pt is at baseline. Pt able to answer some questions, however, speech often unintelligible and garbled.          General Comments   VSS      Pertinent Vitals/Pain Pain Assessment: No/denies pain Faces Pain Scale: No hurt           PT Goals (current goals can now be found in the care plan section) Acute Rehab PT Goals PT Goal Formulation: Patient unable to participate in goal setting Time For Goal Achievement: 09/02/19 Potential to Achieve Goals: Fair Progress towards PT goals: Progressing toward goals    Frequency    Min 2X/week      PT Plan Current plan remains appropriate       AM-PAC PT "6 Clicks" Mobility  Outcome Measure  Help needed turning from your back to your side while in a flat bed without using bedrails?: A Lot Help needed moving from lying on your back to sitting on the side of a flat bed without using bedrails?: Total Help needed moving to and from a bed to a chair (including a wheelchair)?: Total Help needed standing up from a chair using your arms (e.g., wheelchair or bedside chair)?: Total Help needed to walk in hospital room?: Total Help needed climbing 3-5 steps with a railing? : Total 6 Click Score: 7    End of Session Equipment Utilized During Treatment: Gait belt Activity Tolerance: Patient tolerated  treatment well Patient left: in bed;with call bell/phone within reach Nurse Communication: Mobility status PT Visit Diagnosis: Unsteadiness on feet (R26.81);Muscle weakness (generalized) (M62.81);History of falling (Z91.81);Repeated falls (R29.6);Difficulty in walking, not elsewhere classified (R26.2)     Time: [id number] PT Time Calculation (min) (ACUTE ONLY): 15 min  Charges:  $Therapeutic Activity: 8-22 mins                     Shaneque Merkle B. Migdalia Dk PT, DPT Acute Rehabilitation Services Pager [phone] Office [phone]    [place] 08/22/2019, 12:25 PM

## 2019-08-22 NOTE — Care Management (Addendum)
    Durable Medical Equipment  (From admission, onward)         Start     Ordered   08/22/19 1050  For home use only DME Hospital bed  Once    Question Answer Comment  Length of Need Lifetime   Patient has (list medical condition): debility, dementia, rhabdomylosis   The above medical condition requires: Patient requires the ability to reposition frequently   Head must be elevated greater than: 30 degrees   Bed type Semi-electric   Support Surface: Gel Overlay      08/22/19 1051   08/19/19 1359  For home use only DME Hospital bed  Once    Question Answer Comment  Length of Need Lifetime   Patient has (list medical condition): Dementia   Head must be elevated greater than: 45 degrees   Bed type Semi-electric   Support Surface: Low Air loss Mattress      08/19/19 1359

## 2019-08-22 NOTE — TOC Transition Note (Addendum)
Transition of Care [facility]) - CM/SW Discharge Note   Patient Details  Name: Gary Roy MRN: [id number] Date of Birth: [birth year]  [facility]) CM/SW Contact:  Carles Collet, RN Phone Number: 08/22/2019, 11:36 AM   Clinical Narrative:    [name] w wife earlier this morning with CSW. She does not want him to go to SNF. She sates she "has her own home care to care for him." She "doesn't see the necessity" of hh PT OT or other disciplines, but is agreeable to RN. Discussed rating, and agreed to [facility], referral accepted.  Wife wants hospital bed and it delivered before PTAR transport home, she has room cleared and ready. Referral made to [facility] to set up delivery with wife. SHe denied need for any other DME, she wants him to use his walker to get to BR and states it is only steps away. She is agreeable to DC today. She is agreeable to call back after bed has been delivered to their house so we can call PTAR to take patient home.   [facility], next on listAlvis Lemmings accepted referral.   PCP [name]  4pm- bed has not been delivered yet, spoke w [name] and she will call [name] the [place] to let her know when it arrives so PTAR can be called.    Final next level of care: Home w Home Health Services Barriers to Discharge: No Barriers Identified   Patient Goals and CMS Choice Patient states their goals for this hospitalization and ongoing recovery are:: wife- "I have my pwn home care for him" wants hime to DC home CMS Medicare.gov Compare Post Acute Care list provided to:: Other (Comment Required)(wife) Choice offered to / list presented to : Spouse  Discharge Placement PASRR number recieved: 08/20/19            Patient chooses bed at: (Wife's preference is [facility]. Clinicals transmitted to this facility)        Discharge Plan and Services In-house Referral: Clinical Social Work              DME Arranged: Hospital bed DME  Agency: [employer] Date DME Agency Contacted: 08/22/19 Time DME Agency Contacted: 75 Representative spoke with at DME Agency: [facility]ack HH Arranged: RN [name] Agency: [facility] [employer] Date [place]: 08/22/19 Time [place]: [place] Representative spoke with at [place]: [name] ([place]) Interventions     Readmission Risk Interventions No flowsheet data found.

## 2019-08-22 NOTE — TOC Progression Note (Signed)
Transition of Care [facility]) - Progression Note    Patient Details  Name: Gary Roy MRN: [id number] Date of Birth: 01-28-1928  Transition of Care [facility]) CM/SW Contact  Sharlet Salina Mila Homer, LCSW Phone Number: 08/22/2019, 12:46 PM  Clinical Narrative:  Received call from Mrs. Barringer regarding discharge plan for her husband. She wants to take him home and needs a hospital bed. Mrs. Ledonne reported that she was told her husband really perked up yesterday after her visit, so she wants to bring him home. Mrs. Vaquera requested a hospital bed and was agreeable to a nurse coming in a couple of times a week after talking with nurse case manager Debbie. Mrs. Randel also indicated that she plans to increase the caregiver time for her husband. Wife asked to call CSW once hospital bed delivered and ambulance transport will be arranged. MD contacted and updated on discharge plan and what is needed from him in terms of equipment and home health.     Expected Discharge Plan: Skilled Nursing Facility Barriers to Discharge: No Barriers Identified  Expected Discharge Plan and Services Expected Discharge Plan: [place] In-house Referral: Clinical Social Work     Living arrangements for the past 2 months: Apartment                 DME Arranged: Hospital bed DME Agency: [employer] Date DME Agency Contacted: 08/22/19 Time DME Agency Contacted: 80 Representative spoke with at DME Agency: zack HH Arranged: RN [place] Agency: [facility] [employer] Date [place]: 08/22/19 Time [place]: 1136 Representative spoke with at [place]: [place] ([place]) Interventions  No SDOH interventions needed  Readmission Risk Interventions No flowsheet data found.

## 2019-09-13 ENCOUNTER — Emergency Department (HOSPITAL_COMMUNITY): Payer: Medicare Other

## 2019-09-13 ENCOUNTER — Emergency Department (HOSPITAL_COMMUNITY)
Admission: EM | Admit: 2019-09-13 | Discharge: 2019-09-13 | Disposition: A | Discharge status: Home / Self Care | Payer: Medicare Other | Attending: Emergency Medicine | Admitting: Emergency Medicine

## 2019-09-13 DIAGNOSIS — Z79899 Other long term (current) drug therapy: Secondary | ICD-10-CM | POA: Insufficient documentation

## 2019-09-13 DIAGNOSIS — F0390 Unspecified dementia without behavioral disturbance: Secondary | ICD-10-CM | POA: Insufficient documentation

## 2019-09-13 DIAGNOSIS — I129 Hypertensive chronic kidney disease with stage 1 through stage 4 chronic kidney disease, or unspecified chronic kidney disease: Secondary | ICD-10-CM | POA: Insufficient documentation

## 2019-09-13 DIAGNOSIS — K5900 Constipation, unspecified: Secondary | ICD-10-CM | POA: Diagnosis present

## 2019-09-13 DIAGNOSIS — N183 Chronic kidney disease, stage 3 unspecified: Secondary | ICD-10-CM | POA: Insufficient documentation

## 2019-09-13 LAB — CBC WITH DIFFERENTIAL/PLATELET
Abs Immature Granulocytes: 0.01 10*3/uL (ref 0.00–0.07)
Basophils Absolute: 0 10*3/uL (ref 0.0–0.1)
Basophils Relative: 1 %
Eosinophils Absolute: 0.2 10*3/uL (ref 0.0–0.5)
Eosinophils Relative: 3 %
HCT: 37.3 % — ABNORMAL LOW (ref 39.0–52.0)
Hemoglobin: 11.3 g/dL — ABNORMAL LOW (ref 13.0–17.0)
Immature Granulocytes: 0 %
Lymphocytes Relative: 25 %
Lymphs Abs: 1.5 10*3/uL (ref 0.7–4.0)
MCH: 31.8 pg (ref 26.0–34.0)
MCHC: 30.3 g/dL (ref 30.0–36.0)
MCV: 105.1 fL — ABNORMAL HIGH (ref 80.0–100.0)
Monocytes Absolute: 0.6 10*3/uL (ref 0.1–1.0)
Monocytes Relative: 11 %
Neutro Abs: 3.7 10*3/uL (ref 1.7–7.7)
Neutrophils Relative %: 60 %
Platelets: 183 10*3/uL (ref 150–400)
RBC: 3.55 MIL/uL — ABNORMAL LOW (ref 4.22–5.81)
RDW: 13.1 % (ref 11.5–15.5)
WBC: 6.1 10*3/uL (ref 4.0–10.5)
nRBC: 0 % (ref 0.0–0.2)

## 2019-09-13 LAB — COMPREHENSIVE METABOLIC PANEL
ALT: 14 U/L (ref 0–44)
AST: 21 U/L (ref 15–41)
Albumin: 3.3 g/dL — ABNORMAL LOW (ref 3.5–5.0)
Alkaline Phosphatase: 39 U/L (ref 38–126)
Anion gap: 10 (ref 5–15)
BUN: 23 mg/dL (ref 8–23)
CO2: 24 mmol/L (ref 22–32)
Calcium: 8.7 mg/dL — ABNORMAL LOW (ref 8.9–10.3)
Chloride: 103 mmol/L (ref 98–111)
Creatinine, Ser: 1.31 mg/dL — ABNORMAL HIGH (ref 0.61–1.24)
GFR calc Af Amer: 56 mL/min — ABNORMAL LOW (ref >60)
GFR calc non Af Amer: 48 mL/min — ABNORMAL LOW (ref >60)
Glucose, Bld: 113 mg/dL — ABNORMAL HIGH (ref 70–99)
Potassium: 4.8 mmol/L (ref 3.5–5.1)
Sodium: 137 mmol/L (ref 135–145)
Total Bilirubin: 0.7 mg/dL (ref 0.3–1.2)
Total Protein: 6.5 g/dL (ref 6.5–8.1)

## 2019-09-13 LAB — LIPASE, BLOOD: Lipase: 43 U/L (ref 11–51)

## 2019-09-13 MED ORDER — SODIUM CHLORIDE 0.9 % IV BOLUS
500.0000 mL | Freq: Once | INTRAVENOUS | Status: AC
  Administered 2019-09-13: 15:00:00 500 mL via INTRAVENOUS

## 2019-09-13 MED ORDER — MAGNESIUM CITRATE PO SOLN
1.0000 | Freq: Once | ORAL | Status: AC
  Administered 2019-09-13: 1 via ORAL
  Filled 2019-09-13: qty 296

## 2019-09-13 MED ORDER — IOHEXOL 300 MG/ML  SOLN
100.0000 mL | Freq: Once | INTRAMUSCULAR | Status: AC | PRN
  Administered 2019-09-13: 100 mL via INTRAVENOUS

## 2019-09-13 NOTE — ED Notes (Signed)
Patient transported to X-ray 

## 2019-09-13 NOTE — ED Provider Notes (Signed)
  Physical Exam  BP (!) 156/96   Pulse (!) 49   Temp 98.5 F (36.9 C) (Oral)   Resp 19   SpO2 99%   Physical Exam Gen: appears nontoxic. Pleasantly confused.  Abd: nontedner   ED Course/Procedures     Procedures  MDM   Pt signed out to me by B Henderly, PA-C. Please see previous notes for further history.   In brief, patient presenting for evaluation of constipation.  He has a history of dementia, sent by facility who states he has not had a bowel movement in 9 days.  At baseline mental status.  Facility states he has been given glycerin suppositories, MiraLAX, and enemas without improvement.  No vomiting or change in p.o. intake.  Plan is for labs and CT to ensure no obstruction.  If normal, treat for constipation and can likely be discharged.  Patient had a good bowel movement.  Remains nontoxic on my exam.  Abdomen nontender.  Plan for discharge.         Franchot Heidelberg, PA-C 09/13/19 2322    Quintella Reichert, MD 09/14/19 0030

## 2019-09-13 NOTE — ED Notes (Signed)
Went over d/c instructions with Caryl Pina, CNA. Discussed findings, medications, return precautions and need to ensure pt stay well hydrated.  Caryl Pina, CNA at [employer] [facility] verbalized understanding of all of the above.  Pt cannot e-sign d/t advanced dementia.  PTAR has been called.  Pt is awaiting transport in hall bed. Pt is comfortable, respirations even and unlabored.

## 2019-09-13 NOTE — ED Notes (Signed)
Pt had large amounts of soft, liquid stool when starting to do enema.  Pt cleansed and repositioned.  He was also able to drink the mag citrate successfully.  No distress or agitation.  Pleasantly confused.

## 2019-09-13 NOTE — Discharge Instructions (Signed)
Continue to treat constipation as needed with MiraLAX, stool softeners, and mag citrate. Make sure he stays well-hydrated water. Follow-up with a primary care doctor as needed for further management. Return to the emergency room if he develops fevers, vomiting, severe abdominal pain, any new, worsening, or concerning symptoms.

## 2019-09-13 NOTE — ED Notes (Signed)
Gary Roy [id number], husband. Asking for update before patient leaves

## 2019-09-13 NOTE — ED Triage Notes (Signed)
In by Ems with reports of no reg BM x 9 days.  ALF have used Miralax, enemas, glycerin supp without significant relief.   No nausea or vomiting, pt states no pain.  No responses with palpation to abd.  Pt hx of dementia, but alert and calm. Pt alert to person only.

## 2019-09-13 NOTE — ED Provider Notes (Signed)
[facility] EMERGENCY DEPARTMENT Provider Note   CSN: [id number] Arrival date & time: 09/13/19  1305    History   Chief Complaint Constipation  HPI Taylan Stitts is a 85 y.o. male with past medical history significant for hypertension, dementia who presents for evaluation of constipation.    Unfortunately patient with history of dementia and is unable to provide any history.  Per EMS patient has not had a "normal" bowel movement in the last 9 days.  His facility has been trying glycerin suppositories, MiraLAX, enemas without relief.  Per facility patient has been tolerating p.o. intake.  Has not any nausea, emesis.  She has no complaints on exam.  Patient only alert to person which is at baseline.  Level 5 CAVEAT - Dementia     HPI  Past Medical History:  Diagnosis Date  . Cancer ([place])   . Dementia ([place])   . Hypertension     Patient Active Problem List   Diagnosis Date Noted  . Palliative care by specialist   . DNR (do not resuscitate)   . Lewy body dementia with behavioral disturbance ([place])   . Fall at home, initial encounter 08/20/2019  . Fall at home 08/19/2019  . Fall   . Elevated CK   . AKI (acute kidney injury) ([place])   . Stage 3 chronic kidney disease     Past Surgical History:  Procedure Laterality Date  . BREAST LUMPECTOMY         Home Medications    Prior to Admission medications   Medication Sig Start Date End Date Taking? Authorizing Provider  amLODipine (NORVASC) 5 MG tablet Take 2.5 mg by mouth daily.    [provider]  atorvastatin (LIPITOR) 20 MG tablet Take 20 mg by mouth at bedtime.    [provider]  donepezil (ARICEPT) 10 MG tablet Take 1 tablet (10 mg total) by mouth at bedtime. 08/22/19   Seawell, Jaimie A, DO  esomeprazole (NEXIUM) 40 MG capsule Take 40 mg by mouth daily at 12 noon.    [provider]  lisinopril (ZESTRIL) 20 MG tablet Take 10 mg by mouth daily.    [provider]  memantine (NAMENDA) 10 MG tablet Take 10 mg by mouth 2 (two) times daily.    [provider]  solifenacin (VESICARE) 10 MG tablet Take 10 mg by mouth every evening.    [provider]  tamoxifen (NOLVADEX) 20 MG tablet Take 20 mg by mouth every evening.    [provider]    Family History No family history on file.  Social History Social History   Tobacco Use  . Smoking status: Not on file  Substance Use Topics  . Alcohol use: Not on file  . Drug use: Not on file     Allergies   Patient has no known allergies.   Review of Systems Review of Systems  Unable to perform ROS: Dementia   Physical Exam Updated Vital Signs BP (!) 153/57   Pulse (!) 48   Temp 98.5 F (36.9 C) (Oral)   Resp 16   SpO2 99%   Physical Exam Vitals signs and nursing note reviewed.  Constitutional:      General: He is not in acute distress.    Appearance: He is well-developed. He is not toxic-appearing or diaphoretic.     Comments: Chronically ill-appearing.  HENT:     Head: Normocephalic and atraumatic.     Jaw: There is normal jaw occlusion.  Mouth/Throat:     Mouth: Mucous membranes are dry.     Pharynx: Oropharynx is clear. Uvula midline.     Comments: Mucous membranes dry Eyes:     Pupils: Pupils are equal, round, and reactive to light.     Comments: Clear crusting to bilateral medial canthus.  No purulent drainage.  Pupils equal reactive to light.  Neck:     Musculoskeletal: Full passive range of motion without pain, normal range of motion and neck supple.     Trachea: Trachea and phonation normal.     Comments: No neck stiffness. Cardiovascular:     Rate and Rhythm: Regular rhythm. Bradycardia present.     Pulses: Normal pulses.          Dorsalis pedis pulses are 2+ on the right side and 2+ on the left side.       Posterior tibial pulses are 2+ on the right side and 2+ on the left side.     Heart sounds: Normal heart sounds.     Comments:  Bradycardia to 55-58 in room Pulmonary:     Effort: Pulmonary effort is normal. No respiratory distress.     Breath sounds: Normal breath sounds and air entry.  Abdominal:     General: There is no distension.     Palpations: Abdomen is soft.     Tenderness: There is no abdominal tenderness. There is no right CVA tenderness, left CVA tenderness, guarding or rebound.     Hernia: No hernia is present.     Comments: Non-tender to palpation. Hyperactive bowel sounds.  Musculoskeletal: Normal range of motion.     Comments: Spontaneously moves all 4 extremities.  Feet:     Right foot:     Toenail Condition: Right toenails are abnormally thick.     Left foot:     Toenail Condition: Left toenails are abnormally thick.  Skin:    General: Skin is warm and dry.     Comments: No edema, erythema or warmth.  Neurological:     Mental Status: He is alert.     Comments: Oriented to person however not place and time. (Baseline per facility) No facial droop.    ED Treatments / Results  Labs (all labs ordered are listed, but only abnormal results are displayed) Labs Reviewed  CBC WITH DIFFERENTIAL/PLATELET - Abnormal; Notable for the following components:      Result Value   RBC 3.55 (*)    Hemoglobin 11.3 (*)    HCT 37.3 (*)    MCV 105.1 (*)    All other components within normal limits  COMPREHENSIVE METABOLIC PANEL - Abnormal; Notable for the following components:   Glucose, Bld 113 (*)    Creatinine, Ser 1.31 (*)    Calcium 8.7 (*)    Albumin 3.3 (*)    GFR calc non Af Amer 48 (*)    GFR calc Af Amer 56 (*)    All other components within normal limits  LIPASE, BLOOD    EKG None  Radiology Dg Abdomen Acute W/chest  Result Date: 09/13/2019 CLINICAL DATA:  Constipation EXAM: DG ABDOMEN ACUTE W/ 1V CHEST COMPARISON:  None. FINDINGS: Single view chest demonstrates slight elevation of the right diaphragm. Patchy atelectasis or scarring at the left base. No pleural effusion. Borderline to  mild cardiomegaly with aortic atherosclerosis. No pneumothorax. Supine and upright views of the abdomen demonstrate no free air beneath the diaphragm. Nonobstructed gas pattern with moderate stool. IMPRESSION: 1. Borderline to mild cardiomegaly  with patchy atelectasis or scar at the left base 2. Nonobstructed gas pattern with moderate stool Electronically Signed   By: Donavan Foil M.D.   On: 09/13/2019 14:09   Procedures Procedures (including critical care time)  Medications Ordered in ED Medications  sodium chloride 0.9 % bolus 500 mL (500 mLs Intravenous New Bag/Given 09/13/19 1447)   Initial Impression / Assessment and Plan / ED Course  I have reviewed the triage vital signs and the nursing notes.  Pertinent labs & imaging results that were available during my care of the patient were reviewed by me and considered in my medical decision making (see chart for details).  85 year old male presents for evaluation of symptoms of constipation.  I was unable to reach facility and patient has dementia at baseline and is unable to provide history.  His heart and lungs are clear.  Per EMS facility has tried MiraLAX, enemas and glycerin suppositories.  I was unable to reach facility for collateral information, called 2x.  His abdomen is nontender.  He does appear thin however not cachectic.  He is at baseline mentation per EMS.  Plan for labs, CT scan. Will hold on rectal exam until CT AP.  CBC without leukocytosis.  Care transferred to call [place] PA-C who will follow up on CT AP, EKG and remaining labs. If constipation on CT likely rectal exam with disimpaction and enema and dc back to facility if tolerating PO intake. Care transferred to [facility] who will determine ultimate treatment, plan and disposition.  Patient has been seen by attending physician, Dr. Billy Fischer who agrees with above treatment, plan and disposition.      Final Clinical Impressions(s) / ED Diagnoses   Final  diagnoses:  Constipation, unspecified constipation type    ED Discharge Orders    None       Orin Eberwein A, PA-C 09/13/19 1536    Gareth Morgan, MD 09/13/19 2041

## 2019-09-13 NOTE — ED Notes (Signed)
[facility] called for pt 

## 2019-09-13 NOTE — ED Notes (Signed)
pts wife called- evelyn wanting an update. Upset she has not received any sort of update please call at [phone]

## 2019-09-15 ENCOUNTER — Other Ambulatory Visit: Payer: Self-pay | Admitting: Internal Medicine

## 2019-09-26 ENCOUNTER — Encounter: Payer: Self-pay | Admitting: *Deleted

## 2019-09-26 ENCOUNTER — Other Ambulatory Visit: Payer: Self-pay | Admitting: *Deleted

## 2019-09-30 ENCOUNTER — Ambulatory Visit (INDEPENDENT_AMBULATORY_CARE_PROVIDER_SITE_OTHER): Payer: Medicare Other | Admitting: Diagnostic Neuroimaging

## 2019-09-30 ENCOUNTER — Encounter: Payer: Self-pay | Admitting: Diagnostic Neuroimaging

## 2019-09-30 ENCOUNTER — Other Ambulatory Visit: Payer: Self-pay

## 2019-09-30 VITALS — BP 119/67 | HR 46 | Temp 97.5°F

## 2019-09-30 DIAGNOSIS — F0390 Unspecified dementia without behavioral disturbance: Secondary | ICD-10-CM | POA: Diagnosis not present

## 2019-09-30 DIAGNOSIS — F03C0 Unspecified dementia, severe, without behavioral disturbance, psychotic disturbance, mood disturbance, and anxiety: Secondary | ICD-10-CM

## 2019-09-30 NOTE — Progress Notes (Signed)
[facility]  PATIENT: Gary Roy DOB: Mar 18, 1928  REFERRING CLINICIAN: H Tellez HISTORY FROM: patient  REASON FOR VISIT: new consult    HISTORICAL  CHIEF COMPLAINT:  Chief Complaint  Patient presents with  . Dementia    rm 7,  New Pt, wife- Gary Roy, dgtr- Gary Roy, "patient and wife have moved to [place], transfer of care "    HISTORY OF PRESENT ILLNESS:   85 year old male here for evaluation of severe dementia.  Patient had onset of shuffling gait and memory loss, difficulty with finances starting in 2013.  In 2015 he was diagnosed with dementia, possibly dementia with Lewy bodies.  He was treated with memantine and donepezil.  He was on Celexa at that time.  Patient and wife now moved to [place] in January 2020 and lives in independent living at [facility].  Wife is primary caregiver.  They have some aides that help out.  He is almost completely dependent on others for ADLs.  He has limited verbal output.  He is not able to walk.  He is having incontinence issues.  Also having stomach pain, diarrhea and constipation.  He has visual hallucinations.  He is calm for the most part, sometimes gets agitated with certain caregivers.   REVIEW OF SYSTEMS: Full 14 system review of systems performed and negative with exception of: As per HPI.  Allergies incontinence slurred speech memory loss confusion weight loss.  Hypertension diabetes hypercholesterolemia bladder cancer.  ALLERGIES: Allergies  Allergen Reactions  . Pollen Extract Other (See Comments)    HOME MEDICATIONS: Outpatient Medications Prior to Visit  Medication Sig Dispense Refill  . amLODipine (NORVASC) 5 MG tablet Take 2.5 mg by mouth daily.    Marland Kitchen atorvastatin (LIPITOR) 20 MG tablet Take 20 mg by mouth at bedtime.    . Cholecalciferol (VITAMIN D3) 50 MCG (2000 UT) capsule Take by mouth.    . donepezil (ARICEPT) 10 MG tablet Take 10 mg by mouth at bedtime.    Marland Kitchen esomeprazole (NEXIUM) 40 MG  capsule Take 40 mg by mouth daily at 12 noon.    Marland Kitchen JANUVIA 50 MG tablet Take 50 mg by mouth daily.    . memantine (NAMENDA) 10 MG tablet Take 10 mg by mouth 2 (two) times daily.    . Probiotic Product (PROBIOTIC DAILY PO) Take by mouth.    . solifenacin (VESICARE) 10 MG tablet Take 10 mg by mouth every evening.    . tamoxifen (NOLVADEX) 20 MG tablet Take 20 mg by mouth every evening.    . citalopram (CELEXA) 10 MG tablet Take 10 mg by mouth at bedtime.    Marland Kitchen lisinopril (ZESTRIL) 20 MG tablet Take 10 mg by mouth daily.    . Loratadine 10 MG CAPS Take by mouth as needed.    . Multiple Vitamin (MULTI-VITAMIN) tablet Take by mouth.     No facility-administered medications prior to visit.     PAST MEDICAL HISTORY: Past Medical History:  Diagnosis Date  . BPH (benign prostatic hyperplasia)   . Cancer ([place]) 06/2017   left breast, bladder   . CKD (chronic kidney disease), stage III 2014  . Dementia ([place]) 12/2015  . Dizziness and giddiness   . Hypertension 2014  . Melanoma ([place]) 06/2016  . Migraine with aura, not intractable, without status migrainosus   . Other amnesia   . Restlessness and agitation   . Type 2 diabetes mellitus ([place]) 2014    PAST SURGICAL HISTORY: Past Surgical History:  Procedure Laterality  Date  . BREAST LUMPECTOMY  2017, left  . SKIN CANCER EXCISION  2015   face  . TONSILLECTOMY      FAMILY HISTORY: Family History  Problem Relation Age of Onset  . Breast cancer Mother   . Arthritis Mother   . Cancer Father   . Arthritis Father   . Cancer Sister        colon  . Cancer Sister        NHL    SOCIAL HISTORY: Social History   Socioeconomic History  . Marital status: Married    Spouse name: Gary Roy  . Number of children: 2  . Years of education: Not on file  . Highest education level: Master's degree (e.g., MA, MS, MEng, MEd, MSW, MBA)  Occupational History    Comment: retired  Scientific laboratory technician  . Financial resource strain: Not on file  . Food  insecurity    Worry: Not on file    Inability: Not on file  . Transportation needs    Medical: Not on file    Non-medical: Not on file  Tobacco Use  . Smoking status: Former Smoker    Packs/day: 1.00    Years: 30.00    Pack years: 30.00  . Smokeless tobacco: Never Used  . Tobacco comment: quit 20 yrs ago  Substance and Sexual Activity  . Alcohol use: Yes    Alcohol/week: 1.0 standard drinks    Types: 1 Shots of liquor per week    Comment: 1 shot/day  . Drug use: Never  . Sexual activity: Not on file  Lifestyle  . Physical activity    Days per week: Not on file    Minutes per session: Not on file  . Stress: Not on file  Relationships  . Social Herbalist on phone: Not on file    Gets together: Not on file    Attends religious service: Not on file    Active member of club or organization: Not on file    Attends meetings of clubs or organizations: Not on file    Relationship status: Not on file  . Intimate partner violence    Fear of current or ex partner: Not on file    Emotionally abused: Not on file    Physically abused: Not on file    Forced sexual activity: Not on file  Other Topics Concern  . Not on file  Social History Narrative   09/30/19 lives [facility] at [facility]   Caffeine- tea x 2-3      PHYSICAL EXAM  GENERAL EXAM/CONSTITUTIONAL: Vitals:  Vitals:   09/30/19 1112  BP: 119/67  Pulse: (!) 46  Temp: (!) 97.5 F (36.4 C)     There is no height or weight on file to calculate BMI. Wt Readings from Last 3 Encounters:  08/21/19 161 lb (73 kg)  03/04/19 143 lb (64.9 kg)     Patient is in no distress; well developed, nourished and groomed; neck is supple  CARDIOVASCULAR:  Examination of carotid arteries is normal; no carotid bruits  Regular rate and rhythm, no murmurs  Examination of peripheral vascular system by observation and palpation is normal  EYES:  Ophthalmoscopic exam of optic discs and posterior segments is  normal; no papilledema or hemorrhages  No exam data present  MUSCULOSKELETAL:  Gait, strength, tone, movements noted in Neurologic exam below  NEUROLOGIC: MENTAL STATUS:  MMSE - Mini Mental State Exam 09/30/2019  Not completed: Unable to  complete    awake, alert, oriented to person  [facility] memory   DECR attention and concentration  LIMITED VERBAL OUTPUT; FOLLOW SOME SIMPLE COMMANDS THROUGH IMITATION  LIMITED fund of knowledge   CRANIAL NERVE:   2nd - no papilledema on fundoscopic exam  2nd, 3rd, 4th, 6th - pupils equal and reactive to light, visual fields full to confrontation, extraocular muscles intact, no nystagmus  5th - facial sensation symmetric  7th - facial strength symmetric  8th - hearing intact  9th - palate elevates symmetrically, uvula midline  11th - shoulder shrug symmetric  12th - tongue protrusion midline  MOTOR:   THIN FRAIL; PARATONIA; MOVES UPPER EXT 3/5; BLE 1-2/5  SENSORY:   normal and symmetric to light touch  COORDINATION:   finger-nose-finger, fine finger movements --> CANNOT FOLLOW COMMANDS  REFLEXES:   deep tendon reflexes 1+ and symmetric; ABSENT AT ANKLES  GAIT/STATION:   IN WHEEL CHAIR     DIAGNOSTIC DATA (LABS, IMAGING, TESTING) - I reviewed patient records, labs, notes, testing and imaging myself where available.  Lab Results  Component Value Date   WBC 6.1 09/13/2019   HGB 11.3 (L) 09/13/2019   HCT 37.3 (L) 09/13/2019   MCV 105.1 (H) 09/13/2019   PLT 183 09/13/2019      Component Value Date/Time   NA 137 09/13/2019 1347   K 4.8 09/13/2019 1347   CL 103 09/13/2019 1347   CO2 24 09/13/2019 1347   GLUCOSE 113 (H) 09/13/2019 1347   BUN 23 09/13/2019 1347   CREATININE 1.31 (H) 09/13/2019 1347   CALCIUM 8.7 (L) 09/13/2019 1347   PROT 6.5 09/13/2019 1347   ALBUMIN 3.3 (L) 09/13/2019 1347   AST 21 09/13/2019 1347   ALT 14 09/13/2019 1347   ALKPHOS 39 09/13/2019 1347   BILITOT 0.7 09/13/2019 1347    GFRNONAA 48 (L) 09/13/2019 1347   GFRAA 56 (L) 09/13/2019 1347   No results found for: CHOL, HDL, LDLCALC, LDLDIRECT, TRIG, CHOLHDL Lab Results  Component Value Date   HGBA1C 6.0 (H) 08/19/2019   Lab Results  Component Value Date   VITAMINB12 488 08/20/2019   Lab Results  Component Value Date   TSH 1.862 08/19/2019       ASSESSMENT AND PLAN  85 y.o. year old male here with severe dementia (possible DLB).  Dx:  1. Severe dementia ([place])     PLAN:  SEVERE DEMENTIA - stop donepezil (due to GI side effects / symptoms of diarrhea) - continue memantine - supportive care; safety / supervision issues reviewed - caregiver resources provided  Return for return to PCP, pending if symptoms worsen or fail to improve.    Penni Bombard, MD [id number], [id number] AM Certified in Neurology, Neurophysiology and Neuroimaging  [facility] [address] [place], [place] [address] [phone]

## 2019-10-07 ENCOUNTER — Telehealth: Payer: Self-pay | Admitting: Diagnostic Neuroimaging

## 2019-10-07 NOTE — Telephone Encounter (Signed)
Pts daughter [name] [facility]) called in and stated she would like a call back to discuss what meds the patient should be on currently

## 2019-10-07 NOTE — Telephone Encounter (Signed)
If they have seen PCP, then would request that PCP refill these meds. -VRP

## 2019-10-07 NOTE — Telephone Encounter (Signed)
Called Nadine and informed her of Dr [employer] reply. She verbalized understanding, appreciation.

## 2019-10-07 NOTE — Telephone Encounter (Signed)
Called Nadine who stated patient's wife stopped donepezil, and patient hasn't had any further diarrhea. She  asked if Dr Leta Baptist would refill Celexa and memantine for patient. These were prescribed by neurologist in [place]. She stated that he is established with PCP but was just seen a month ago.  I advised her he may give 3 months then she can contact PCP for future refills as patient was released to PCP.  She  verbalized understanding, appreciation.

## 2020-06-27 IMAGING — CT CT ABD-PELV W/ CM
2 of 6 series · 16 of 46 positions shown, 18 images · IV contrast (Omni 300)
Comparison: Radiograph same date.

CLINICAL DATA: Change in bowel habits with constipation
unresponsive to treatment. No nausea or vomiting. History of
dementia.

EXAM:
CT ABDOMEN AND PELVIS WITH CONTRAST
TECHNIQUE: Multidetector CT imaging of the abdomen and pelvis was performed
using the standard protocol following bolus administration of
intravenous contrast.
CONTRAST:  100mL OMNIPAQUE IOHEXOL 300 MG/ML  SOLN

[Series 3: a/p w/ 5mm · axial · 0.75mm/px · z∈[+829,+1219]mm · 13 of 90 slices shown, 15 images]
[im 6/90  soft-tissue]
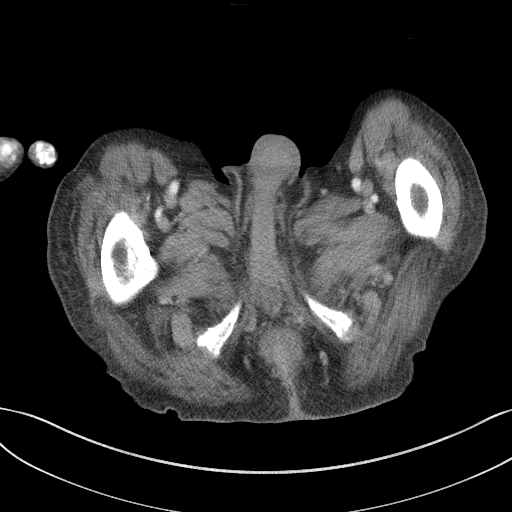
[im 6/90  bone]
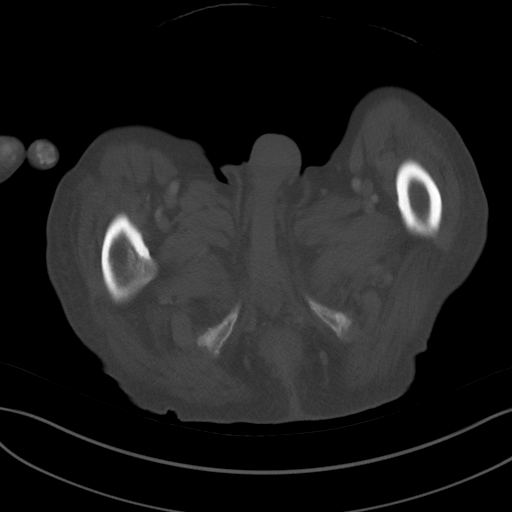
[im 12/90  soft-tissue]
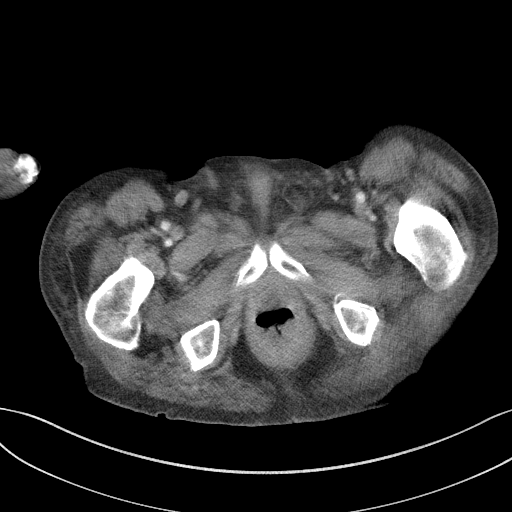
[im 17/90  soft-tissue]
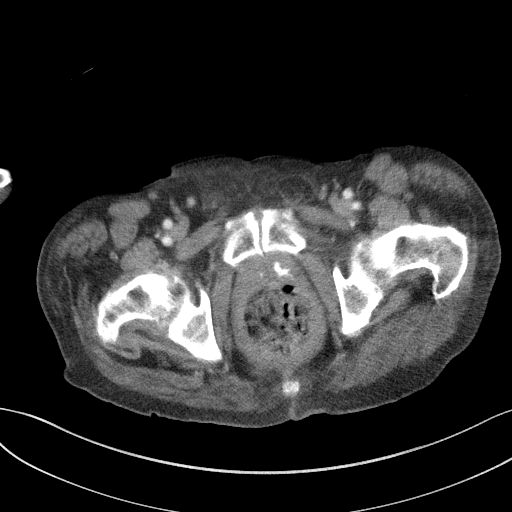
[im 28/90  soft-tissue]
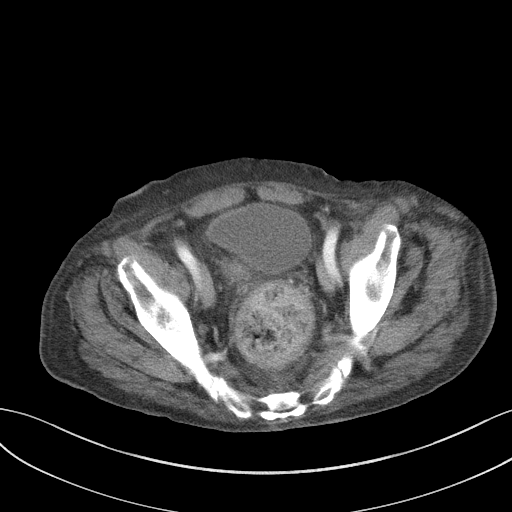
[im 34/90  soft-tissue]
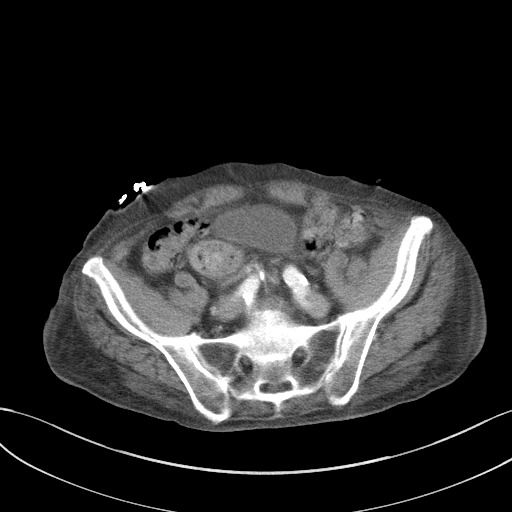
[im 39/90  soft-tissue]
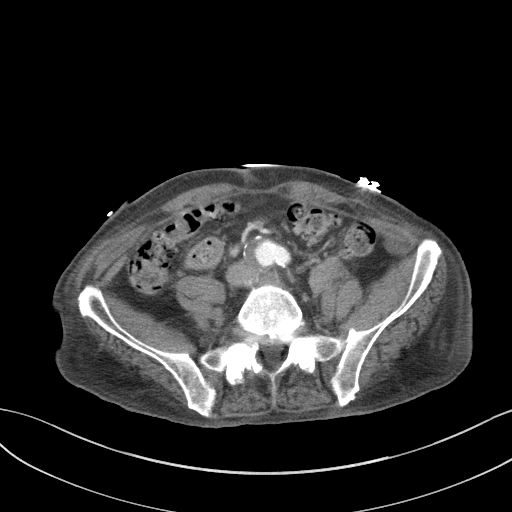
[im 45/90  soft-tissue]
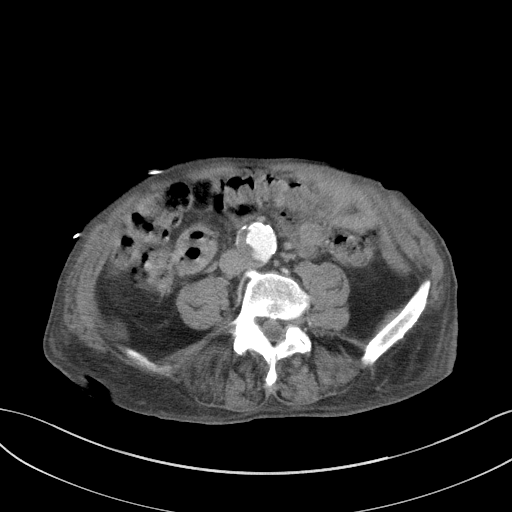
[im 51/90  soft-tissue]
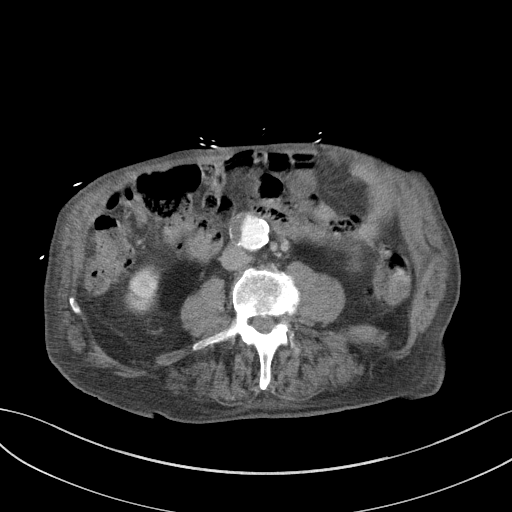
[im 56/90  soft-tissue]
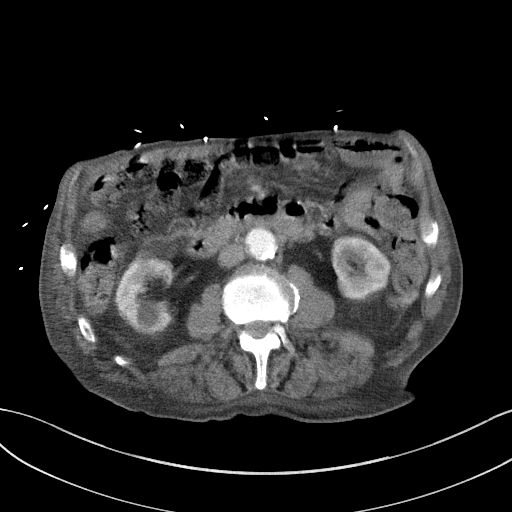
[im 56/90  bone]
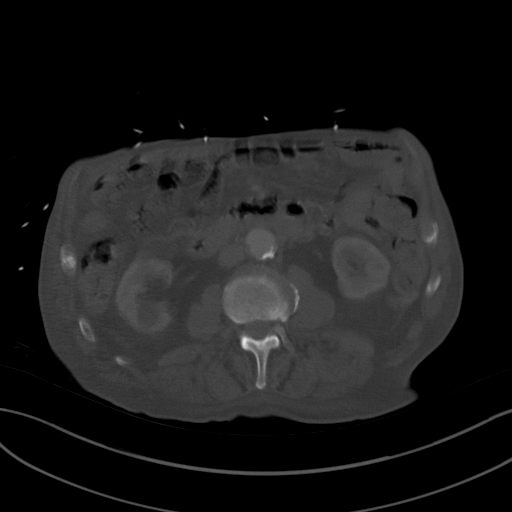
[im 62/90  soft-tissue]
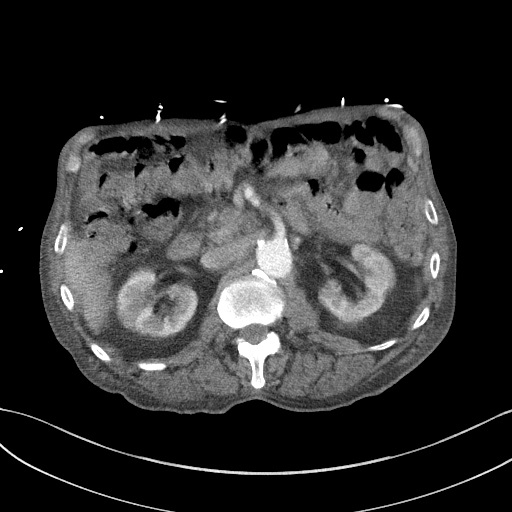
[im 73/90  soft-tissue]
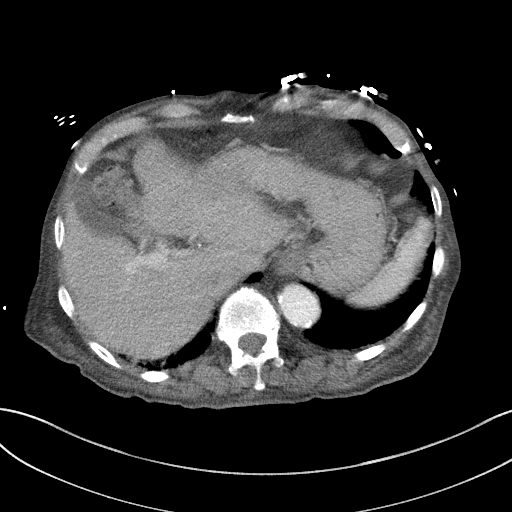
[im 78/90  soft-tissue]
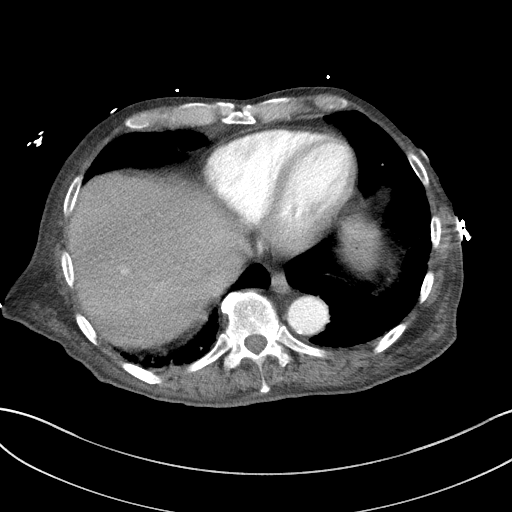
[im 84/90  soft-tissue]
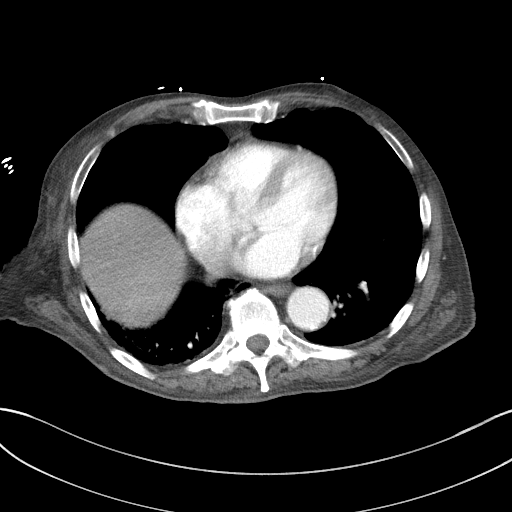

[Series 6: a/p w/ cor · coronal · 0.81mm/px · 3 of 148 slices shown]
[im 50/148  soft-tissue]
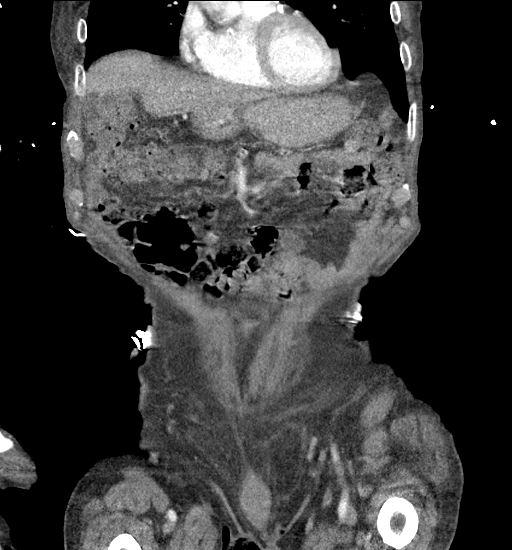
[im 66/148  soft-tissue]
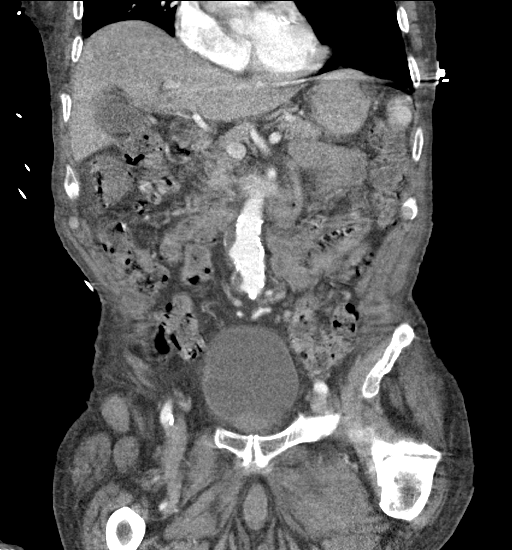
[im 82/148  soft-tissue]
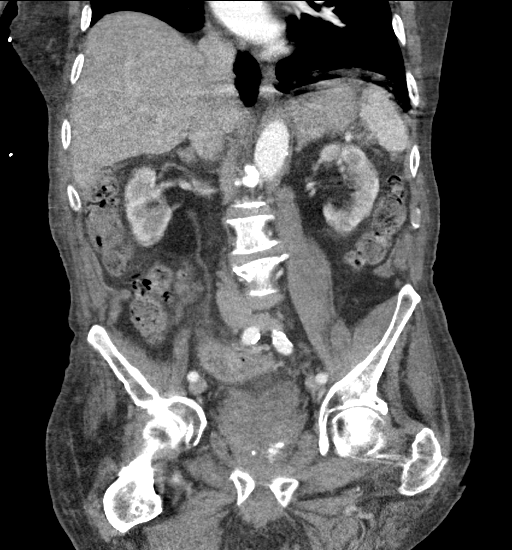

[16 of 46 positions shown; findings below may reference images not displayed]

FINDINGS: Despite efforts by the technologist and patient, mild motion
artifact is present on today's exam and could not be eliminated.
This reduces exam sensitivity and specificity.

Lower chest: Probable dependent atelectasis at both lung bases
superimposed on mild emphysema. There is atherosclerosis of the
aorta and coronary arteries.

Hepatobiliary: The liver is normal in density without suspicious
focal abnormality. No evidence of gallstones, gallbladder wall
thickening or biliary dilatation.

Pancreas: Unremarkable. No pancreatic ductal dilatation or
surrounding inflammatory changes.

Spleen: Normal in size without focal abnormality.

Adrenals/Urinary Tract: Both adrenal glands appear normal. Small
bilateral renal cysts. No evidence of renal mass, urinary tract
calculus or hydronephrosis. The bladder appears unremarkable.

Stomach/Bowel: No enteric contrast was administered. The stomach is
decompressed with possible mild diffuse wall thickening. No small
bowel distention, wall thickening or surrounding inflammation. There
is moderate stool throughout the colon, especially in the rectum. No
rectal wall thickening identified. There is mild perirectal soft
tissue stranding without focal fluid collection.

Vascular/Lymphatic: There are no enlarged abdominal or pelvic lymph
nodes. Diffuse aortic and branch vessel atherosclerosis with
aneurysmal dilatation of the abdominal aorta. The aorta has maximal
AP diameter of 3.3 cm and is associated with irregular mural
thrombus. There is also mild dilatation of the right common iliac
artery to 2.3 cm. No evidence of aneurysm leak or large vessel
occlusion.

Reproductive: The prostate gland and seminal vesicles demonstrate no
significant findings.

Other: Mild anasarca with generalized soft tissue edema. No
abdominal wall hernia or significant ascites.

Musculoskeletal: No acute or significant osseous findings. Mild
lumbar spondylosis.
IMPRESSION: 1. Moderate stool throughout the colon, especially in the rectum,
suggesting constipation. No evidence of bowel obstruction or
perforation. Mild perirectal soft tissue stranding could indicate
mild stercoral colitis.
2. Aortic Atherosclerosis ([id number]-XIJ.J) with mild aneurysmal
dilatation of the abdominal aorta and right common iliac artery.
Recommend followup by ultrasound in 3 years. This recommendation
follows ACR consensus guidelines: White Paper of the ACR [employer]

## 2020-06-27 IMAGING — CR DG ABDOMEN ACUTE W/ 1V CHEST
3 series · 3 of 3 positions shown · non-contrast
Comparison: None.

CLINICAL DATA: Constipation

EXAM:
DG ABDOMEN ACUTE W/ 1V CHEST

[abdomen erect]
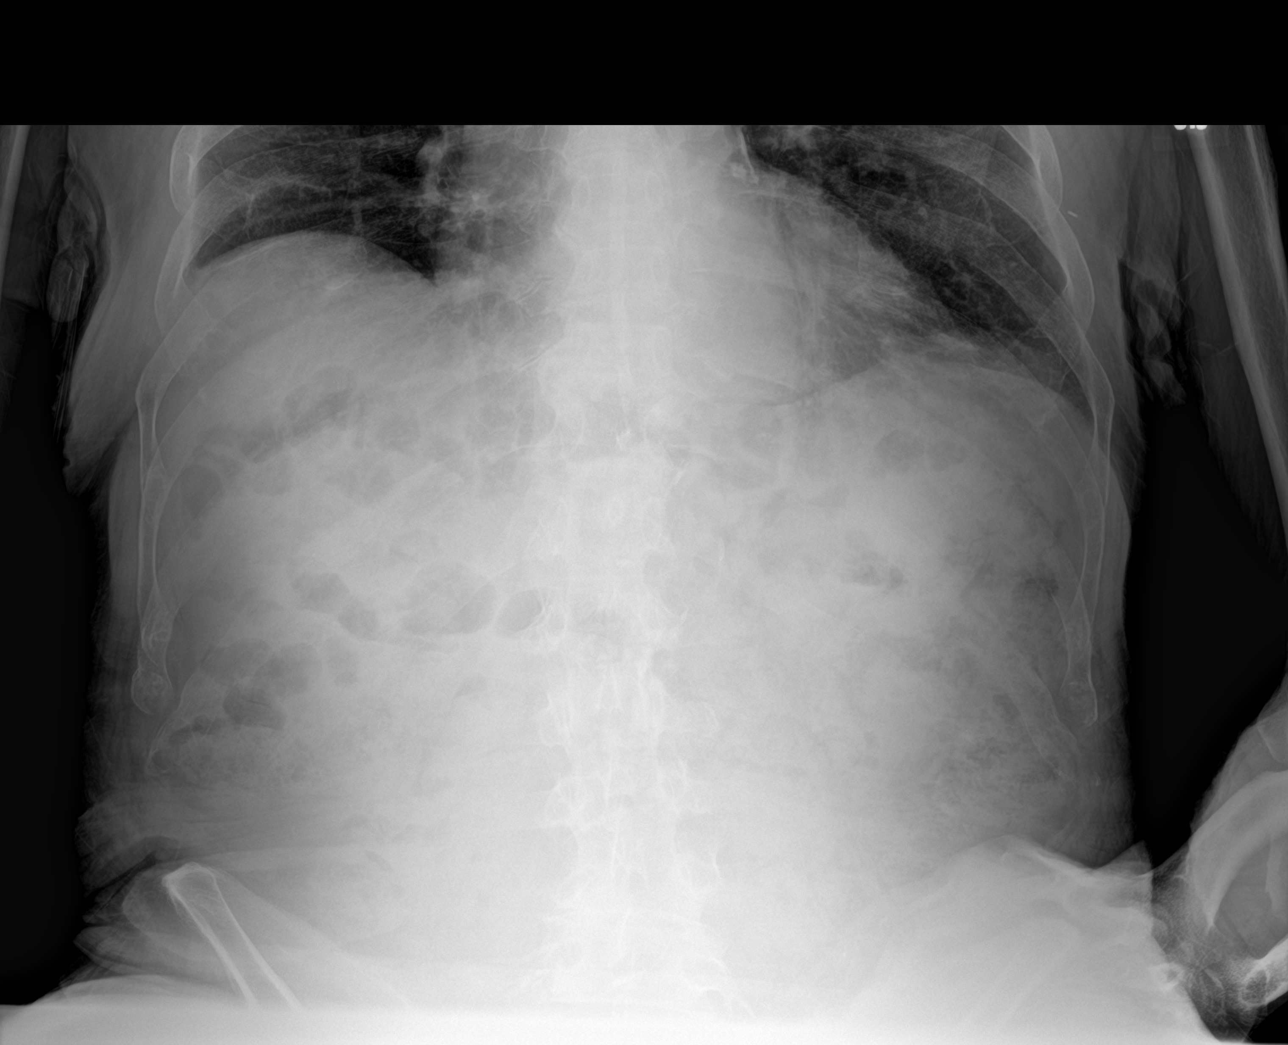

[abdomen supine]
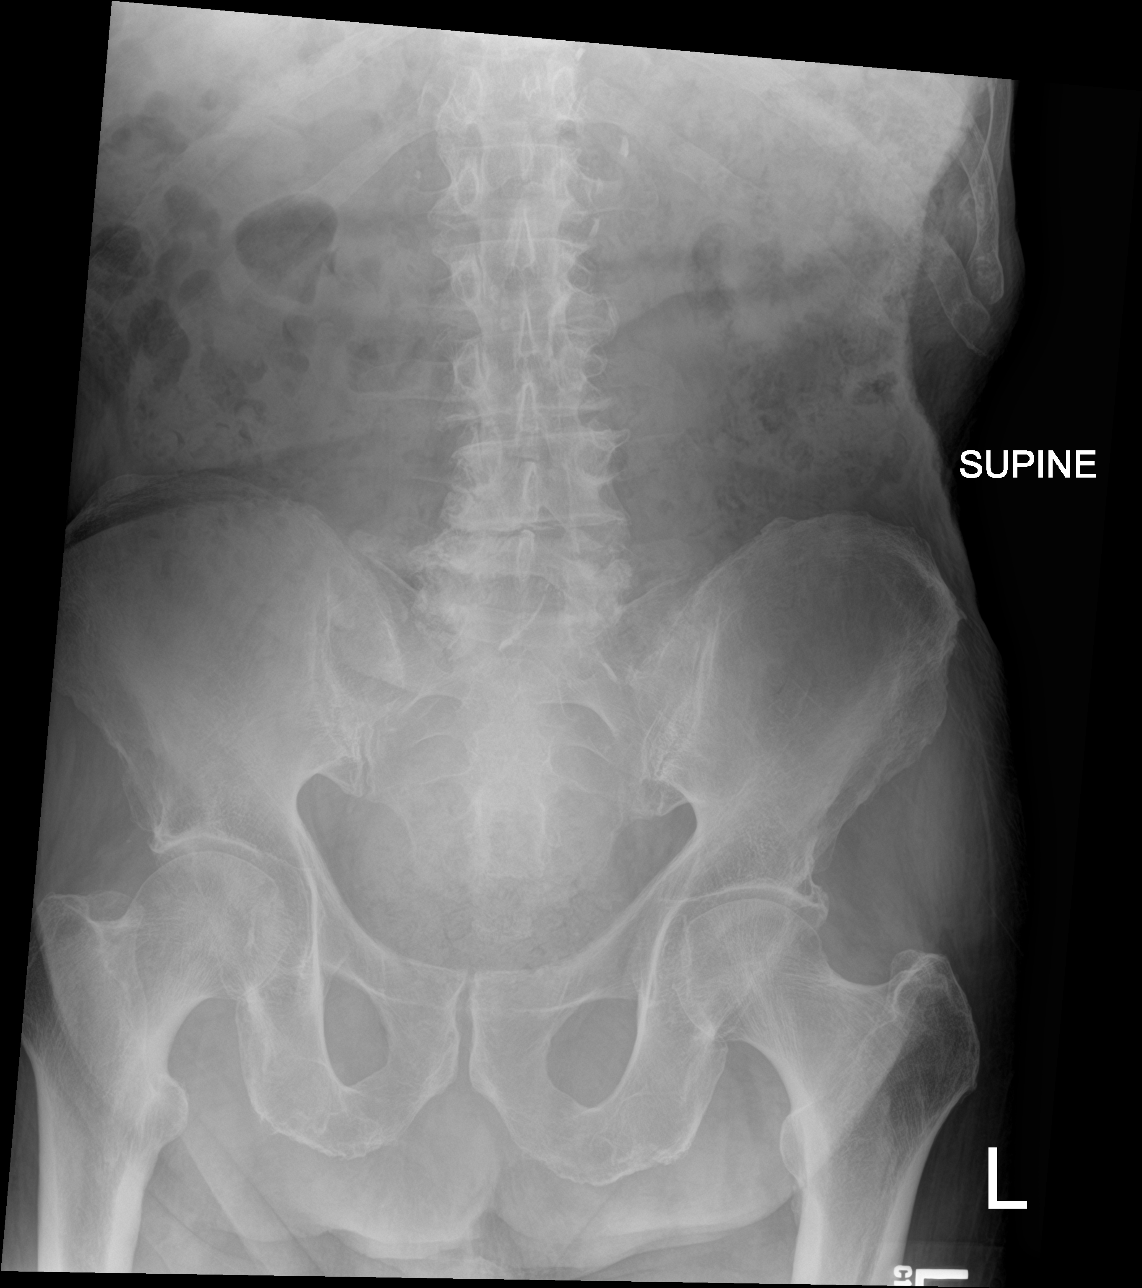

[chest ap]
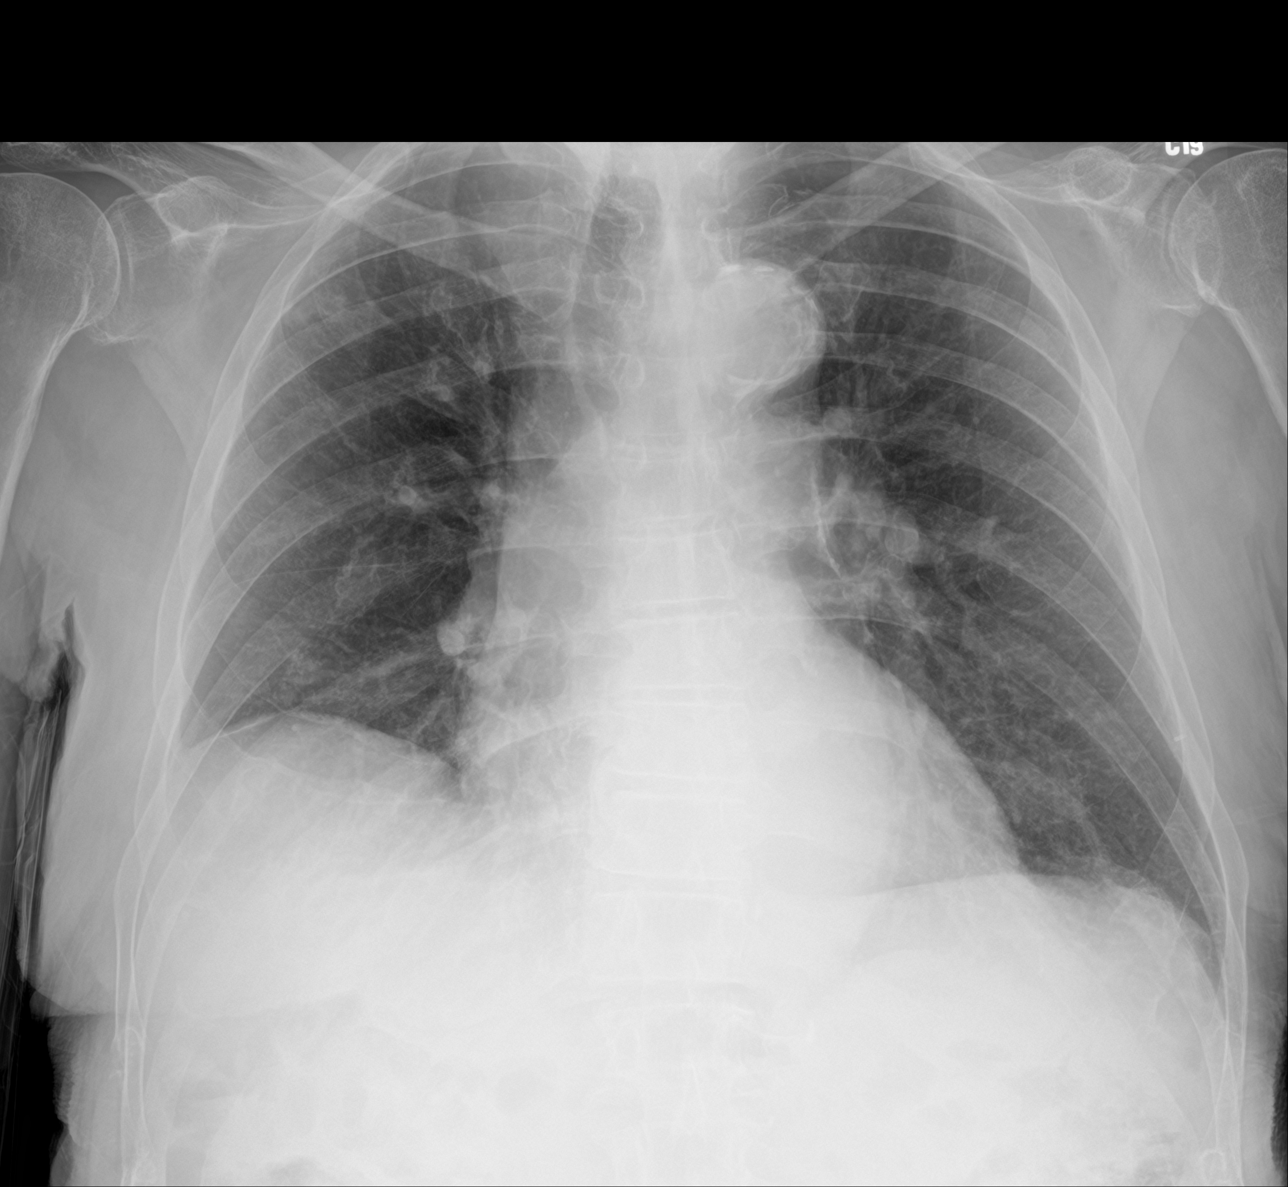

[3 of 3 positions shown; findings below may reference images not displayed]

FINDINGS: Single view chest demonstrates slight elevation of the right
diaphragm. Patchy atelectasis or scarring at the left base. No
pleural effusion. Borderline to mild cardiomegaly with aortic
atherosclerosis. No pneumothorax.

Supine and upright views of the abdomen demonstrate no free air
beneath the diaphragm. Nonobstructed gas pattern with moderate
stool.
IMPRESSION: 1. Borderline to mild cardiomegaly with patchy atelectasis or scar
at the left base
2. Nonobstructed gas pattern with moderate stool

## 2021-04-07 DEATH — deceased
